# Patient Record
Sex: Female | Born: 1950 | Race: White | Hispanic: No | Smoking: Never smoker
Health system: Southern US, Community
[De-identification: ages and names within clinical notes are randomized; demographics above are authoritative.]

## PROBLEM LIST (undated history)

## (undated) DIAGNOSIS — I341 Nonrheumatic mitral (valve) prolapse: Secondary | ICD-10-CM

## (undated) DIAGNOSIS — K4090 Unilateral inguinal hernia, without obstruction or gangrene, not specified as recurrent: Secondary | ICD-10-CM

## (undated) DIAGNOSIS — K449 Diaphragmatic hernia without obstruction or gangrene: Secondary | ICD-10-CM

## (undated) DIAGNOSIS — N200 Calculus of kidney: Secondary | ICD-10-CM

## (undated) HISTORY — PX: LITHOTRIPSY: SUR834

---

## 2016-10-03 DIAGNOSIS — N200 Calculus of kidney: Secondary | ICD-10-CM | POA: Diagnosis not present

## 2016-10-08 DIAGNOSIS — M5430 Sciatica, unspecified side: Secondary | ICD-10-CM | POA: Diagnosis not present

## 2016-12-01 ENCOUNTER — Emergency Department (HOSPITAL_COMMUNITY): Admission: EM | Admit: 2016-12-01 | Payer: Self-pay | Source: Home / Self Care

## 2016-12-01 ENCOUNTER — Inpatient Hospital Stay (HOSPITAL_COMMUNITY)
Admission: EM | Admit: 2016-12-01 | Discharge: 2016-12-04 | DRG: 479 | Disposition: A | Discharge status: Home Health | Payer: BC Managed Care – PPO | Attending: Internal Medicine | Admitting: Internal Medicine

## 2016-12-01 ENCOUNTER — Encounter (HOSPITAL_COMMUNITY): Payer: Self-pay | Admitting: Internal Medicine

## 2016-12-01 DIAGNOSIS — R739 Hyperglycemia, unspecified: Secondary | ICD-10-CM | POA: Diagnosis present

## 2016-12-01 DIAGNOSIS — M4620 Osteomyelitis of vertebra, site unspecified: Secondary | ICD-10-CM | POA: Diagnosis not present

## 2016-12-01 DIAGNOSIS — D638 Anemia in other chronic diseases classified elsewhere: Secondary | ICD-10-CM | POA: Diagnosis present

## 2016-12-01 DIAGNOSIS — M4626 Osteomyelitis of vertebra, lumbar region: Secondary | ICD-10-CM | POA: Diagnosis present

## 2016-12-01 DIAGNOSIS — G8929 Other chronic pain: Secondary | ICD-10-CM | POA: Diagnosis present

## 2016-12-01 DIAGNOSIS — E876 Hypokalemia: Secondary | ICD-10-CM

## 2016-12-01 DIAGNOSIS — Z87442 Personal history of urinary calculi: Secondary | ICD-10-CM

## 2016-12-01 DIAGNOSIS — M25552 Pain in left hip: Secondary | ICD-10-CM | POA: Diagnosis present

## 2016-12-01 DIAGNOSIS — I341 Nonrheumatic mitral (valve) prolapse: Secondary | ICD-10-CM | POA: Diagnosis present

## 2016-12-01 DIAGNOSIS — B9620 Unspecified Escherichia coli [E. coli] as the cause of diseases classified elsewhere: Secondary | ICD-10-CM | POA: Diagnosis present

## 2016-12-01 DIAGNOSIS — D649 Anemia, unspecified: Secondary | ICD-10-CM | POA: Diagnosis present

## 2016-12-01 DIAGNOSIS — M545 Low back pain, unspecified: Secondary | ICD-10-CM

## 2016-12-01 DIAGNOSIS — M4646 Discitis, unspecified, lumbar region: Secondary | ICD-10-CM | POA: Diagnosis present

## 2016-12-01 HISTORY — DX: Diaphragmatic hernia without obstruction or gangrene: K44.9

## 2016-12-01 HISTORY — DX: Nonrheumatic mitral (valve) prolapse: I34.1

## 2016-12-01 HISTORY — DX: Calculus of kidney: N20.0

## 2016-12-01 HISTORY — DX: Unilateral inguinal hernia, without obstruction or gangrene, not specified as recurrent: K40.90

## 2016-12-01 LAB — COMPREHENSIVE METABOLIC PANEL
ALK PHOS: 92 U/L (ref 38–126)
ALT: 10 U/L — ABNORMAL LOW (ref 14–54)
ANION GAP: 9 (ref 5–15)
AST: 17 U/L (ref 15–41)
Albumin: 3.3 g/dL — ABNORMAL LOW (ref 3.5–5.0)
BILIRUBIN TOTAL: 0.4 mg/dL (ref 0.3–1.2)
BUN: 8 mg/dL (ref 6–20)
CALCIUM: 8.9 mg/dL (ref 8.9–10.3)
CO2: 25 mmol/L (ref 22–32)
Chloride: 101 mmol/L (ref 101–111)
Creatinine, Ser: 0.47 mg/dL (ref 0.44–1.00)
GFR calc non Af Amer: >60 mL/min (ref >60)
Glucose, Bld: 118 mg/dL — ABNORMAL HIGH (ref 65–99)
POTASSIUM: 3.4 mmol/L — AB (ref 3.5–5.1)
SODIUM: 135 mmol/L (ref 135–145)
Total Protein: 6.9 g/dL (ref 6.5–8.1)

## 2016-12-01 LAB — CBC WITH DIFFERENTIAL/PLATELET
BASOS PCT: 0 %
Basophils Absolute: 0 10*3/uL (ref 0.0–0.1)
Eosinophils Absolute: 0.1 10*3/uL (ref 0.0–0.7)
Eosinophils Relative: 1 %
HEMATOCRIT: 32 % — AB (ref 36.0–46.0)
HEMOGLOBIN: 10.4 g/dL — AB (ref 12.0–15.0)
Lymphocytes Relative: 22 %
Lymphs Abs: 1.2 10*3/uL (ref 0.7–4.0)
MCH: 28.7 pg (ref 26.0–34.0)
MCHC: 32.5 g/dL (ref 30.0–36.0)
MCV: 88.4 fL (ref 78.0–100.0)
Monocytes Absolute: 0.2 10*3/uL (ref 0.1–1.0)
Monocytes Relative: 5 %
NEUTROS ABS: 3.9 10*3/uL (ref 1.7–7.7)
NEUTROS PCT: 72 %
Platelets: 352 10*3/uL (ref 150–400)
RBC: 3.62 MIL/uL — AB (ref 3.87–5.11)
RDW: 13.6 % (ref 11.5–15.5)
WBC: 5.3 10*3/uL (ref 4.0–10.5)

## 2016-12-01 LAB — URINALYSIS, ROUTINE W REFLEX MICROSCOPIC
Bilirubin Urine: NEGATIVE
Glucose, UA: NEGATIVE mg/dL
HGB URINE DIPSTICK: NEGATIVE
Ketones, ur: 20 mg/dL — AB
Leukocytes, UA: NEGATIVE
NITRITE: NEGATIVE
PROTEIN: NEGATIVE mg/dL
Specific Gravity, Urine: 1.013 (ref 1.005–1.030)
pH: 6 (ref 5.0–8.0)

## 2016-12-01 LAB — I-STAT BETA HCG BLOOD, ED (MC, WL, AP ONLY): I-stat hCG, quantitative: <5 m[IU]/mL (ref <5)

## 2016-12-01 LAB — I-STAT CG4 LACTIC ACID, ED: Lactic Acid, Venous: 1.1 mmol/L (ref 0.5–1.9)

## 2016-12-01 MED ORDER — ACETAMINOPHEN 325 MG PO TABS
650.0000 mg | ORAL_TABLET | Freq: Four times a day (QID) | ORAL | Status: DC | PRN
  Administered 2016-12-01 – 2016-12-03 (×4): 650 mg via ORAL
  Filled 2016-12-01 (×4): qty 2

## 2016-12-01 MED ORDER — ACETAMINOPHEN 650 MG RE SUPP: 650.0000 mg | Freq: Four times a day (QID) | RECTAL | Status: DC | PRN

## 2016-12-01 NOTE — ED Notes (Signed)
Pt here for abnormal MRI results and has disc from [facility]. Pt states she was told she has a bulging disc and possible infection.

## 2016-12-01 NOTE — ED Provider Notes (Signed)
[facility] DEPT Provider Note   CSN: [id number] Arrival date & time: 12/01/16  1117  History   Chief Complaint Chief Complaint  Patient presents with  . Back Pain    HPI Audrey LINDSLEY is a 66 y.o. female.  This is a 66 year old female with history of nephrolithiasis status post lithotripsy 2 weeks prior who presents with recurrent back pain for the past 2 months.  Patient denies any falls, recent travel, fevers, night sweats, chills, weight loss.  She does not smoke, denies illicit drug use especially IV drug use.  Patient stated she initially had sciatica over 1 month ago and was diagnosed with a herniated disc she was given ibuprofen, Tylenol as well as tramadol.  She followed up with sports medicine however her back pain continued after her kidney stone was treated.  Patient was evaluated recently at [facility] where an MRI of the lumbar spine was performed.  Apparently they called the patient today with results and that she should go to the ED for further management.  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  Denies sick contacts, denies history of cancer, denies any sciatica-like symptoms, decreased sensation, urinary or stool incontinence.  Denies any skin rashes.   The history is provided by the patient and a relative.    Past Medical History:  Diagnosis Date  . Hiatal hernia   . Inguinal hernia   . Mitral valve prolapse   . Nephrolithiasis     Patient Active Problem List   Diagnosis Date Noted  . 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 CESAREAN SECTION    . LITHOTRIPSY      OB History    No data available       Home Medications    Prior to Admission medications   Medication Sig Start Date End Date Taking? Authorizing Provider  ibuprofen (ADVIL,MOTRIN) 800 MG tablet Take 800 mg by mouth every 8 (eight) hours. With food 11/20/16   Yes [provider]  traMADol (ULTRAM) 50 MG tablet Take 50 mg by mouth every 6 (six) hours as needed for pain. 10/25/16  Yes [provider]    Family History History reviewed. No pertinent family history.  Social History Social History  Substance Use Topics  . Smoking status: Never Smoker  . Smokeless tobacco: Never Used  . Alcohol use No     Allergies   Patient has no known allergies.   Review of Systems Review of Systems  Constitutional: Negative for appetite change, chills, diaphoresis, fatigue, fever and unexpected weight change.  HENT: Negative for ear pain and sore throat.   Eyes: Negative for pain and visual disturbance.  Respiratory: Negative for cough and shortness of breath.   Cardiovascular: Positive for leg swelling. Negative for chest pain and palpitations.  Gastrointestinal: Negative for abdominal pain, constipation, diarrhea and vomiting.  Genitourinary: Negative for decreased urine volume, dysuria and hematuria.  Musculoskeletal: Positive for back pain. Negative for arthralgias and gait problem.  Skin: Negative for color change and rash.  Allergic/Immunologic: Negative for immunocompromised state.  Neurological: Negative for dizziness, tremors, seizures, syncope, facial asymmetry, speech difficulty, weakness, light-headedness, numbness and headaches.  All other systems reviewed and are negative.    Physical Exam Updated Vital Signs BP 129/74   Pulse 70   Temp 98.1 F (36.7 C) (Oral)   Resp 17   SpO2 97%  Physical Exam  Constitutional: She appears well-developed and well-nourished. No distress.  HENT:  Head: Normocephalic and atraumatic.  Eyes: Pupils are equal, round, and reactive to light. Conjunctivae are normal.  Neck: Neck supple.  Cardiovascular: Normal rate and regular rhythm.   No murmur heard. Pulmonary/Chest: Effort normal and breath sounds normal. No respiratory distress.  Abdominal: Soft. There is no tenderness.    Musculoskeletal: She exhibits no edema.       Cervical back: She exhibits normal range of motion, no tenderness and no bony tenderness.       Thoracic back: She exhibits normal range of motion, no tenderness and no bony tenderness.       Lumbar back: She exhibits normal range of motion, no tenderness, no bony tenderness, no swelling and no edema.  Mild scoliosis present with rightward curvature. Cervical kyphosis.  Neurological: She is alert. She has normal strength. She displays no tremor. No cranial nerve deficit or sensory deficit.  Skin: Skin is warm and dry.  Psychiatric: She has a normal mood and affect.  Nursing note and vitals reviewed.    ED Treatments / Results  Labs (all labs ordered are listed, but only abnormal results are displayed) Labs Reviewed  COMPREHENSIVE METABOLIC PANEL - Abnormal; Notable for the following:       Result Value   Potassium 3.4 (*)    Glucose, Bld 118 (*)    Albumin 3.3 (*)    ALT 10 (*)    All other components within normal limits  CBC WITH DIFFERENTIAL/PLATELET - Abnormal; Notable for the following:    RBC 3.62 (*)    Hemoglobin 10.4 (*)    HCT 32.0 (*)    All other components within normal limits  URINALYSIS, ROUTINE W REFLEX MICROSCOPIC - Abnormal; Notable for the following:    Ketones, ur 20 (*)    All other components within normal limits  CULTURE, BLOOD (ROUTINE X 2)  CULTURE, BLOOD (ROUTINE X 2)  QUANTIFERON TB GOLD ASSAY (BLOOD)  I-STAT BETA HCG BLOOD, ED ([facility], [facility], [facility] ONLY)    EKG  EKG Interpretation  Date/Time:  Thursday December 01 2016 18:29:39 EDT Ventricular Rate:  67 PR Interval:    QRS Duration: 93 QT Interval:  396 QTC Calculation: 418 R Axis:   73 Text Interpretation:  Sinus rhythm Borderline T abnormalities, anterior leads No previous ECGs available Confirmed by Theotis Burrow [phone]) on 12/01/2016 7:43:59 PM       Radiology No results found.  Procedures Procedures (including critical care  time)  Medications Ordered in ED Medications - No data to display   Initial Impression / Assessment and Plan / ED Course  I have reviewed the triage vital signs and the nursing notes.  Pertinent labs & imaging results that were available during my care of the patient were reviewed by me and considered in my medical decision making (see chart for details).     This is a 66 year old female with history of nephrolithiasis status post lithotripsy 2 weeks prior who presents with recurrent back pain for the past 2 months.  Blood cultures drawn, lactate within normal limits.  Exam as noted above, patient neurologically intact with no focal deficits. Bony spine visualized and nontender to palpation.  MRI independently reviewed, radiology report summarizes severe L2-L3 diskitis and concern for osteomyelitis with posterior protruding disc material contributing to severe stenosis.   Hgb 10, no leukocytosis. Blood cultures drawn x2. No antibiotics started in case of need for direct aspiration. Quantiferon gold  assay ordered, UDS ordered.  Neurosurgery called regarding MRI findings.  They stated there is no acute surgical intervention at this time.  Patient should be admitted to medicine with discussion of antibiotic therapy after potential interventional radiology assessment for aspiration.  Discussed admission with medicine who agreed. Patient updated on plan and in agreement. All questions answered. Patient denies pain at this time and declined pain medications.  Final Clinical Impressions(s) / ED Diagnoses   Final diagnoses:  Discitis of lumbar region  Chronic midline low back pain without sciatica    New Prescriptions New Prescriptions   No medications on file      Aldona Lento, MD 12/01/16 2327    Little, Wenda Overland, MD 12/02/16 1233

## 2016-12-01 NOTE — ED Notes (Signed)
Pt given [place] sandwich bag with applesauce, saltines, graham crackers, peanut butter, and water.

## 2016-12-01 NOTE — ED Notes (Signed)
MD Little at the bedside

## 2016-12-01 NOTE — ED Notes (Signed)
[facility] will do next pick up tomorrow morning so collect quantiferon tb gold assay by 0930 on 12/02/2016.  Nurse aware.

## 2016-12-01 NOTE — Progress Notes (Signed)
Called by Dr. Aldona Lento ([place]) who wanted to discuss this patient's case. He did not request formal neurosurgical consultation. He explained that the patient was referred to ER after having had an MRI 3 days ago showing evidence of L2-3 discitis/osteomyelitis. He reports that the patient is having difficulties with back pain, but is neurologically intact. I reviewed the patient's MRI at his request. I feel that she is going to need to be treated for her discitis/osteomyelitis, and recommended admission to the medical service, with infectious disease consultation. I do not feel that she needs neurosurgical intervention at this time. I recommended blood cultures, which he explained had already been done. I anticipate that she may well need a disc space aspiration by interventional radiology for culture. We are available if neurosurgical intervention were to become necessary, but hopefully the patient will be able to be successfully treated with nonsurgical management and follow-up with her primary physician.

## 2016-12-01 NOTE — H&P (Signed)
History and Physical    Audrey Walter RDE:[id number] DOB: 1950-11-18 DOA: 12/01/2016  PCP: Melony Overly, MD Consultants:  Erlinda Hong - urology; Jodi Mourning - orthopedic PA Patient coming from:  Home - lives alone but her daughter is staying with her right now; [facility]: daughter, [phone]  Chief Complaint: back pain  HPI: Audrey Walter is a 66 y.o. female with medical history significant of nephrolithiasis and back pain presenting due to abnormal MRI.  She had a kidney infection a couple of months ago in [place].  She has had "bone scans, MRI, xrays galore".  Kidney stone with lithotrypsy 2 weeks ago.  Hip pain, back pain for 2 months.  They sent her to orthopedics and urology because they weren't sure what was causing her problems.  She has also been to urgent care.  She had an MRI and had an appointment with orthopedics today and he was concerned about osteo and sent her to the ER.  The pain was in her low back but has moved to her right hip and groin.   Denies h/o TB or risky lifestyle behaviors.   ED Course:  Neurosurgery consulted - no acute surgical intervention needed.  Admit, call ID.  Blood cultures, disc space aspiration for culture by IR.  Review of Systems: As per HPI; otherwise review of systems reviewed and negative.   Ambulatory Status:  Ambulates without assistance  Past Medical History:  Diagnosis Date  . Hiatal hernia   . Inguinal hernia   . Mitral valve prolapse   . Nephrolithiasis     Past Surgical History:  Procedure Laterality Date  . CESAREAN SECTION    . LITHOTRIPSY      Social History   Social History  . Marital status: Divorced    Spouse name: N/A  . Number of children: N/A  . Years of education: N/A   Occupational History  . custodian    Social History Main Topics  . Smoking status: Never Smoker  . Smokeless tobacco: Never Used  . Alcohol use No  . Drug use: No  . Sexual activity: Not on file   Other Topics Concern  . Not on file   Social  History Narrative  . No narrative on file    No Known Allergies  History reviewed. No pertinent family history.  Prior to Admission medications   Not on File    Physical Exam: Vitals:   12/01/16 2300 12/01/16 2330 12/02/16 0000 12/02/16 0039  BP: 130/81 135/72 138/71 (!) 155/76  Pulse: 76 65 72 70  Resp: 18 18 20 20   Temp:    98.7 F (37.1 C)  TempSrc:    Oral  SpO2: 98% 97% 97% 98%  Weight:    44.5 kg (98 lb 3.2 oz)  Height:    4' 11"  (1.499 m)     General:  Appears calm and comfortable and is NAD Eyes:  PERRL, EOMI, normal lids, iris ENT:  grossly normal hearing, lips & tongue, mmm Neck:  no LAD, masses or thyromegaly; no carotid bruits Cardiovascular:  RRR, no m/r/g. No LE edema.  Respiratory:   CTA bilaterally with no wheezes/rales/rhonchi.  Normal respiratory effort. Abdomen:  soft, NT, ND, NABS Back:   normal alignment, no CVAT Skin:  no rash or induration seen on limited exam Musculoskeletal:  grossly normal tone BUE/BLE, good ROM, no bony abnormality Lower extremity:  No LE edema.  Limited foot exam with no ulcerations.  2+ distal pulses. Psychiatric:  grossly normal mood  and affect, speech fluent and appropriate, AOx3 Neurologic:  CN 2-12 grossly intact, moves all extremities in coordinated fashion, sensation intact    Radiological Exams on Admission: No results found.  EKG: Independently reviewed.  NSR with rate 67; nonspecific ST changes with no evidence of acute ischemia   Labs on Admission: I have personally reviewed the available labs and imaging studies at the time of the admission.  Pertinent labs:   Glucose 118 WBC 5.3 Hgb 10.4 UA 20 ketones Lactate 1.10 MRI with severe L2-3 diskitis, concern for osteomyelitis with posterior protruding disc material contributing to severe stenosis.     Assessment/Plan Principal Problem:   Vertebral osteomyelitis (HCC) Active Problems:   Hyperglycemia   Normocytic anemia   Vertebral diskitis with  concern for osteomyelitis -Patient presenting with back pain, recent MRI that is concerning for osteomyelitis. -Will check ESR and CRP.  Concerning lab findings would include CRP >13.5, elevated ESR (goal is < 1/2 age +54 if female).   -WBC is often <15; in this case hers is 5.3. -Blood cultures pending. -Will request IR consultation for aspiration and culture. -Will need antibiotics for 4-6 weeks - but will hold until culture can be obtained. -Suggest ID consultation in AM. -With vertebral osteo, needs testing for TB.  This has been ordered. -Further important considerations include nutrition, diabetes control, tobacco cessation.  Will order nutrition consult.  Mild hyperglycemia, will follow.  No h/o tobacco. -Vertebral osteo is more common in the L-spine and usually presents with severe pain.  Will treat with morphine for now. -Anemia may be related, will follow.  DVT prophylaxis: Lovenox Code Status: Full - confirmed with patient/family Family Communication: Daughter present throughout evaluation  Disposition Plan:  Home once clinically improved Consults called: Neurosurgery by telephone; IR; nutrition; will need ID in AM  Admission status: Admit - It is my clinical opinion that admission to INPATIENT is reasonable and necessary because this patient will require at least 2 midnights in the hospital to treat this condition based on the medical complexity of the problems presented.  Given the aforementioned information, the predictability of an adverse outcome is felt to be significant.    Karmen Bongo MD [facility]  If note is complete, please contact covering daytime or nighttime physician. [email or web address] Password [email or web address]  12/02/2016, 12:50 AM

## 2016-12-02 ENCOUNTER — Inpatient Hospital Stay (HOSPITAL_COMMUNITY): Payer: BC Managed Care – PPO

## 2016-12-02 ENCOUNTER — Encounter (HOSPITAL_COMMUNITY): Payer: Self-pay | Admitting: General Surgery

## 2016-12-02 DIAGNOSIS — M545 Low back pain, unspecified: Secondary | ICD-10-CM

## 2016-12-02 DIAGNOSIS — M4646 Discitis, unspecified, lumbar region: Secondary | ICD-10-CM

## 2016-12-02 DIAGNOSIS — M4620 Osteomyelitis of vertebra, site unspecified: Secondary | ICD-10-CM

## 2016-12-02 DIAGNOSIS — M4626 Osteomyelitis of vertebra, lumbar region: Principal | ICD-10-CM

## 2016-12-02 DIAGNOSIS — D649 Anemia, unspecified: Secondary | ICD-10-CM | POA: Diagnosis present

## 2016-12-02 DIAGNOSIS — E876 Hypokalemia: Secondary | ICD-10-CM

## 2016-12-02 DIAGNOSIS — R739 Hyperglycemia, unspecified: Secondary | ICD-10-CM | POA: Diagnosis present

## 2016-12-02 HISTORY — PX: IR FLUORO GUIDED NEEDLE PLC ASPIRATION/INJECTION LOC: IMG2395

## 2016-12-02 LAB — IRON AND TIBC
IRON: 63 ug/dL (ref 28–170)
SATURATION RATIOS: 27 % (ref 10.4–31.8)
TIBC: 237 ug/dL — AB (ref 250–450)
UIBC: 174 ug/dL

## 2016-12-02 LAB — CBC
HEMATOCRIT: 30.2 % — AB (ref 36.0–46.0)
HEMOGLOBIN: 9.9 g/dL — AB (ref 12.0–15.0)
MCH: 28.9 pg (ref 26.0–34.0)
MCHC: 32.8 g/dL (ref 30.0–36.0)
MCV: 88 fL (ref 78.0–100.0)
PLATELETS: 315 10*3/uL (ref 150–400)
RBC: 3.43 MIL/uL — AB (ref 3.87–5.11)
RDW: 13.7 % (ref 11.5–15.5)
WBC: 4.2 10*3/uL (ref 4.0–10.5)

## 2016-12-02 LAB — BASIC METABOLIC PANEL
Anion gap: 7 (ref 5–15)
BUN: 5 mg/dL — ABNORMAL LOW (ref 6–20)
CHLORIDE: 104 mmol/L (ref 101–111)
CO2: 26 mmol/L (ref 22–32)
CREATININE: 0.51 mg/dL (ref 0.44–1.00)
Calcium: 8.9 mg/dL (ref 8.9–10.3)
GFR calc non Af Amer: >60 mL/min (ref >60)
Glucose, Bld: 93 mg/dL (ref 65–99)
POTASSIUM: 3.4 mmol/L — AB (ref 3.5–5.1)
Sodium: 137 mmol/L (ref 135–145)

## 2016-12-02 LAB — PROTIME-INR
INR: 1.1
PROTHROMBIN TIME: 14.2 s (ref 11.4–15.2)

## 2016-12-02 LAB — FERRITIN: Ferritin: 167 ng/mL (ref 11–307)

## 2016-12-02 LAB — RETICULOCYTES
RBC.: 3.46 MIL/uL — ABNORMAL LOW (ref 3.87–5.11)
Retic Count, Absolute: 45 10*3/uL (ref 19.0–186.0)
Retic Ct Pct: 1.3 % (ref 0.4–3.1)

## 2016-12-02 LAB — C-REACTIVE PROTEIN: CRP: 1.6 mg/dL — ABNORMAL HIGH (ref <1.0)

## 2016-12-02 LAB — SEDIMENTATION RATE: Sed Rate: 45 mm/hr — ABNORMAL HIGH (ref 0–22)

## 2016-12-02 MED ORDER — VANCOMYCIN HCL 500 MG IV SOLR
500.0000 mg | Freq: Two times a day (BID) | INTRAVENOUS | Status: DC
  Administered 2016-12-03: 500 mg via INTRAVENOUS
  Filled 2016-12-02 (×2): qty 500

## 2016-12-02 MED ORDER — MIDAZOLAM HCL 2 MG/2ML IJ SOLN
INTRAMUSCULAR | Status: AC
  Filled 2016-12-02: qty 2

## 2016-12-02 MED ORDER — BUPIVACAINE HCL (PF) 0.5 % IJ SOLN
INTRAMUSCULAR | Status: AC
  Filled 2016-12-02: qty 30

## 2016-12-02 MED ORDER — FENTANYL CITRATE (PF) 100 MCG/2ML IJ SOLN
INTRAMUSCULAR | Status: AC | PRN
  Administered 2016-12-02: 25 ug via INTRAVENOUS

## 2016-12-02 MED ORDER — ONDANSETRON HCL 4 MG/2ML IJ SOLN: 4.0000 mg | Freq: Four times a day (QID) | INTRAMUSCULAR | Status: DC | PRN

## 2016-12-02 MED ORDER — LACTATED RINGERS IV SOLN
INTRAVENOUS | Status: DC
  Administered 2016-12-02 (×2): via INTRAVENOUS

## 2016-12-02 MED ORDER — MORPHINE SULFATE (PF) 2 MG/ML IV SOLN: 2.0000 mg | INTRAVENOUS | Status: DC | PRN

## 2016-12-02 MED ORDER — SODIUM CHLORIDE 0.9 % IV SOLN
INTRAVENOUS | Status: AC
  Administered 2016-12-02: 17:00:00 via INTRAVENOUS

## 2016-12-02 MED ORDER — DOCUSATE SODIUM 100 MG PO CAPS
100.0000 mg | ORAL_CAPSULE | Freq: Two times a day (BID) | ORAL | Status: DC
  Administered 2016-12-02 – 2016-12-04 (×4): 100 mg via ORAL
  Filled 2016-12-02 (×5): qty 1

## 2016-12-02 MED ORDER — MIDAZOLAM HCL 2 MG/2ML IJ SOLN
INTRAMUSCULAR | Status: AC | PRN
  Administered 2016-12-02: 1 mg via INTRAVENOUS

## 2016-12-02 MED ORDER — VANCOMYCIN HCL IN DEXTROSE 750-5 MG/150ML-% IV SOLN
750.0000 mg | Freq: Once | INTRAVENOUS | Status: AC
  Administered 2016-12-02: 750 mg via INTRAVENOUS
  Filled 2016-12-02: qty 150

## 2016-12-02 MED ORDER — ENOXAPARIN SODIUM 40 MG/0.4ML ~~LOC~~ SOLN: 40.0000 mg | SUBCUTANEOUS | Status: DC

## 2016-12-02 MED ORDER — POTASSIUM CHLORIDE CRYS ER 20 MEQ PO TBCR
40.0000 meq | EXTENDED_RELEASE_TABLET | Freq: Once | ORAL | Status: AC
  Administered 2016-12-02: 40 meq via ORAL
  Filled 2016-12-02: qty 2

## 2016-12-02 MED ORDER — DEXTROSE 5 % IV SOLN
2.0000 g | INTRAVENOUS | Status: DC
  Administered 2016-12-02 – 2016-12-04 (×3): 2 g via INTRAVENOUS
  Filled 2016-12-02 (×3): qty 2

## 2016-12-02 MED ORDER — BUPIVACAINE HCL 0.25 % IJ SOLN
INTRAMUSCULAR | Status: AC | PRN
Start: 2016-12-02 — End: 2016-12-02
  Administered 2016-12-02: 10 mL

## 2016-12-02 MED ORDER — ONDANSETRON HCL 4 MG PO TABS: 4.0000 mg | ORAL_TABLET | Freq: Four times a day (QID) | ORAL | Status: DC | PRN

## 2016-12-02 MED ORDER — FENTANYL CITRATE (PF) 100 MCG/2ML IJ SOLN
INTRAMUSCULAR | Status: AC
  Filled 2016-12-02: qty 2

## 2016-12-02 MED ORDER — VANCOMYCIN HCL 500 MG IV SOLR
500.0000 mg | Freq: Two times a day (BID) | INTRAVENOUS | Status: DC
  Filled 2016-12-02: qty 500

## 2016-12-02 NOTE — Progress Notes (Addendum)
Pharmacy Antibiotic Note  Audrey Walter is a 66 y.o. female admitted on 12/01/2016 with lumbar diskitis / osteomyelitis.  Plan for IR to aspirate for cx prior to starting antibiotics.  Pharmacy has been consulted for Vancomycin dosing.  Pt also on Rocephin (per MD) pending cx data.  Plan: Vancomycin 750mg  IV x 1, followed by 500mg  IV q12h (goal trough 15-20) Vancomycin trough at steady state (likely Sunday) Rocephin 2gm IV q24h (per MD) Follow renal function, clinical progress, and cx data  Height: 4\' 11"  (149.9 cm) Weight: 98 lb 3.2 oz (44.5 kg) IBW/kg (Calculated) : 43.2  Temp (24hrs), Avg:98.3 F (36.8 C), Min:98 F (36.7 C), Max:98.7 F (37.1 C)   Recent Labs Lab 12/01/16 1147 12/01/16 1814 12/02/16 0640  WBC  --  5.3 4.2  CREATININE  --  0.47 0.51  LATICACIDVEN 1.10  --   --     Estimated Creatinine Clearance: 45.9 mL/min (by C-G formula based on SCr of 0.51 mg/dL).    No Known Allergies  Antimicrobials this admission: Vanc 9/21 >> Rocephin 9/21 >>  Dose adjustments this admission:   Microbiology results: 9/20 blood x 2 >> 9/21 disc aspiration >>  Thank you for allowing pharmacy to be a part of this patient's care.  [employer], Pharm.D., BCPS Clinical Pharmacist 12/02/2016 4:45 PM

## 2016-12-02 NOTE — Procedures (Signed)
S/P fluoro guided disc aspiration at L2-L3 .

## 2016-12-02 NOTE — Sedation Documentation (Signed)
Patient denies pain and is resting comfortably.  

## 2016-12-02 NOTE — Progress Notes (Signed)
Initial Nutrition Assessment  DOCUMENTATION CODES:   Not applicable  INTERVENTION:  - Monitor for diet advancement and provide medically appropriate nutrition supplement  NUTRITION DIAGNOSIS:   Inadequate oral intake related to poor appetite, social / environmental circumstances as evidenced by per patient/family report.  GOAL:   Patient will meet greater than or equal to 90% of their needs  MONITOR:   Diet advancement, Labs, I & O's  REASON FOR ASSESSMENT:   Consult Assessment of nutrition requirement/status  ASSESSMENT:   Pt with PMH of nephrolithiasis and back pain presented with vertebral osteomyelitis, s/p abnormal MRI.    Patient not available at time of visit, in IR.  RD interviewed pt's sister to obtain partial nutritional history. Pt's sister reports pt has maintained a UBW of ~100-115 lbs the past 40 years. No recent weight history available per chart. Pt's sister reports pt consumes very little in a day. Reporting pt could include 2 pieces of toast or a pack of crackers, then works for 8 hours without breaking to eat.  No meal completion percentage completed given pt's NPO status. Per pt's sister, pt has never tried a nutritional supplement but enjoys an occasional milkshake.   Unable to complete nutrition focused physical exam at this time due to pt being out of room. Suspect severe malnutrition, RD to follow-up.   Labs reviewed; K 3.4 Medications; colace  Diet Order:  Diet NPO time specified Except for: Sips with Meds  Skin:  Reviewed, no issues  Last BM:  12/01/16  Height:   Ht Readings from Last 1 Encounters:  12/02/16 4\' 11"  (1.499 m)    Weight:   Wt Readings from Last 1 Encounters:  12/02/16 98 lb 3.2 oz (44.5 kg)    Ideal Body Weight:  45 kg  BMI:  Body mass index is 19.83 kg/m.  Estimated Nutritional Needs:   Kcal:  2119-4174  Protein:  67-85  Fluid:  >/= 1.5 L/d  EDUCATION NEEDS:   Education needs no appropriate at this  time  [place] Ranger, MS, RDN, LDN 12/02/2016 4:08 PM

## 2016-12-02 NOTE — Consult Note (Signed)
Chief Complaint: L2-3 diskitis, osteomyelitis   Referring Physician:Dr. Karmen Bongo  Supervising Physician: Luanne Bras  Patient Status: [facility] - In-pt  HPI: Audrey Walter is a 66 y.o. female who was having a lot of issues with nephrolithiasis and had just passed a stone in mid-July.  After that she started having back pain.  She just assumed it was from another stone.  Over the last 2 months she has been pending workup between urology and orthopedics over back/hip pain.  She finally had an MRI yesterday which I can't see right now, but showed L2-3 diskitis/osteomyelitis.  She has been sent to [facility] for admission and abx therapy.  We have been asked to see her for disc aspiration for culture.  Past Medical History:  Past Medical History:  Diagnosis Date  . Hiatal hernia   . Inguinal hernia   . Mitral valve prolapse   . Nephrolithiasis     Past Surgical History:  Past Surgical History:  Procedure Laterality Date  . CESAREAN SECTION    . LITHOTRIPSY      Family History: History reviewed. No pertinent family history.  Social History:  reports that she has never smoked. She has never used smokeless tobacco. She reports that she does not drink alcohol or use drugs.  Allergies: No Known Allergies  Medications: Medications reviewed in epic  Please HPI for pertinent positives, otherwise complete 10 system ROS negative.  Mallampati Score: MD Evaluation Airway: WNL Heart: WNL Abdomen: WNL Chest/ Lungs: WNL ASA  Classification: 2 Mallampati/Airway Score: One  Physical Exam: BP 137/64 (BP Location: Right Arm)   Pulse 77   Temp 98.2 F (36.8 C) (Oral)   Resp 20   Ht _0  (1.499 m)   Wt 98 lb 3.2 oz (44.5 kg)   SpO2 96%   BMI 19.83 kg/m  Body mass index is 19.83 kg/m. General: pleasant, WD, WN white female who is laying in bed in NAD HEENT: head is normocephalic, atraumatic.  Sclera are noninjected.  PERRL.  Ears and nose without any masses or lesions.   Mouth is pink and moist Heart: regular, rate, and rhythm.  Normal s1,s2. No obvious murmurs, gallops, or rubs noted.  Palpable radial and pedal pulses bilaterally Lungs: CTAB, no wheezes, rhonchi, or rales noted.  Respiratory effort nonlabored Abd: soft, NT, ND, +BS, no masses, hernias, or organomegaly Psych: A&Ox3 with an appropriate affect.   Labs: Results for orders placed or performed during the hospital encounter of 12/01/16 (from the past 48 hour(s))  Comprehensive metabolic panel     Status: Abnormal   Collection Time: 12/01/16  6:14 PM  Result Value Ref Range   Sodium 135 135 - 145 mmol/L   Potassium 3.4 (L) 3.5 - 5.1 mmol/L   Chloride 101 101 - 111 mmol/L   CO2 25 22 - 32 mmol/L   Glucose, Bld 118 (H) 65 - 99 mg/dL   BUN 8 6 - 20 mg/dL   Creatinine, Ser 0.47 0.44 - 1.00 mg/dL   Calcium 8.9 8.9 - 10.3 mg/dL   Total Protein 6.9 6.5 - 8.1 g/dL   Albumin 3.3 (L) 3.5 - 5.0 g/dL   AST 17 15 - 41 U/L   ALT 10 (L) 14 - 54 U/L   Alkaline Phosphatase 92 38 - 126 U/L   Total Bilirubin 0.4 0.3 - 1.2 mg/dL   GFR calc non Af Amer >60 >60 mL/min   GFR calc Af Amer >60 >60 mL/min    Comment: (  NOTE) The eGFR has been calculated using the CKD EPI equation. This calculation has not been validated in all clinical situations. eGFR's persistently <60 mL/min signify possible Chronic Kidney Disease.    Anion gap 9 5 - 15  CBC with Differential     Status: Abnormal   Collection Time: 12/01/16  6:14 PM  Result Value Ref Range   WBC 5.3 4.0 - 10.5 K/uL   RBC 3.62 (L) 3.87 - 5.11 MIL/uL   Hemoglobin 10.4 (L) 12.0 - 15.0 g/dL   HCT 32.0 (L) 36.0 - 46.0 %   MCV 88.4 78.0 - 100.0 fL   MCH 28.7 26.0 - 34.0 pg   MCHC 32.5 30.0 - 36.0 g/dL   RDW 13.6 11.5 - 15.5 %   Platelets 352 150 - 400 K/uL   Neutrophils Relative % 72 %   Neutro Abs 3.9 1.7 - 7.7 K/uL   Lymphocytes Relative 22 %   Lymphs Abs 1.2 0.7 - 4.0 K/uL   Monocytes Relative 5 %   Monocytes Absolute 0.2 0.1 - 1.0 K/uL    Eosinophils Relative 1 %   Eosinophils Absolute 0.1 0.0 - 0.7 K/uL   Basophils Relative 0 %   Basophils Absolute 0.0 0.0 - 0.1 K/uL  I-Stat beta hCG blood, ED ([facility], [facility], [facility] only)     Status: None   Collection Time: 12/01/16  6:22 PM  Result Value Ref Range   I-stat hCG, quantitative <5.0 <5 mIU/mL   Comment 3            Comment:   GEST. AGE      CONC.  (mIU/mL)   <=1 WEEK        5 - 50     2 WEEKS       50 - 500     3 WEEKS       100 - 10,000     4 WEEKS     1,000 - 30,000        FEMALE AND NON-PREGNANT FEMALE:     LESS THAN 5 mIU/mL   Urinalysis, Routine w reflex microscopic     Status: Abnormal   Collection Time: 12/01/16  9:13 PM  Result Value Ref Range   Color, Urine YELLOW YELLOW   APPearance CLEAR CLEAR   Specific Gravity, Urine 1.013 1.005 - 1.030   pH 6.0 5.0 - 8.0   Glucose, UA NEGATIVE NEGATIVE mg/dL   Hgb urine dipstick NEGATIVE NEGATIVE   Bilirubin Urine NEGATIVE NEGATIVE   Ketones, ur 20 (A) NEGATIVE mg/dL   Protein, ur NEGATIVE NEGATIVE mg/dL   Nitrite NEGATIVE NEGATIVE   Leukocytes, UA NEGATIVE NEGATIVE  Basic metabolic panel     Status: Abnormal   Collection Time: 12/02/16  6:40 AM  Result Value Ref Range   Sodium 137 135 - 145 mmol/L   Potassium 3.4 (L) 3.5 - 5.1 mmol/L   Chloride 104 101 - 111 mmol/L   CO2 26 22 - 32 mmol/L   Glucose, Bld 93 65 - 99 mg/dL   BUN 5 (L) 6 - 20 mg/dL   Creatinine, Ser 0.51 0.44 - 1.00 mg/dL   Calcium 8.9 8.9 - 10.3 mg/dL   GFR calc non Af Amer >60 >60 mL/min   GFR calc Af Amer >60 >60 mL/min    Comment: (NOTE) The eGFR has been calculated using the CKD EPI equation. This calculation has not been validated in all clinical situations. eGFR's persistently <60 mL/min signify possible Chronic Kidney Disease.  Anion gap 7 5 - 15  CBC     Status: Abnormal   Collection Time: 12/02/16  6:40 AM  Result Value Ref Range   WBC 4.2 4.0 - 10.5 K/uL   RBC 3.43 (L) 3.87 - 5.11 MIL/uL   Hemoglobin 9.9 (L) 12.0 - 15.0 g/dL   HCT  30.2 (L) 36.0 - 46.0 %   MCV 88.0 78.0 - 100.0 fL   MCH 28.9 26.0 - 34.0 pg   MCHC 32.8 30.0 - 36.0 g/dL   RDW 13.7 11.5 - 15.5 %   Platelets 315 150 - 400 K/uL  C-reactive protein     Status: Abnormal   Collection Time: 12/02/16  6:40 AM  Result Value Ref Range   CRP 1.6 (H) <1.0 mg/dL  Protime-INR     Status: None   Collection Time: 12/02/16  7:29 AM  Result Value Ref Range   Prothrombin Time 14.2 11.4 - 15.2 seconds   INR 1.10   Iron and TIBC     Status: Abnormal   Collection Time: 12/02/16  8:01 AM  Result Value Ref Range   Iron 63 28 - 170 ug/dL   TIBC 237 (L) 250 - 450 ug/dL   Saturation Ratios 27 10.4 - 31.8 %   UIBC 174 ug/dL  Ferritin     Status: None   Collection Time: 12/02/16  8:01 AM  Result Value Ref Range   Ferritin 167 11 - 307 ng/mL  Reticulocytes     Status: Abnormal   Collection Time: 12/02/16  8:01 AM  Result Value Ref Range   Retic Ct Pct 1.3 0.4 - 3.1 %   RBC. 3.46 (L) 3.87 - 5.11 MIL/uL   Retic Count, Absolute 45.0 19.0 - 186.0 K/uL    Imaging: No results found.  Assessment/Plan 1. 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  She is NPO and her labs and vitals have been reviewed. Risks and benefits discussed with the patient including, but not limited to bleeding, infection, damage to adjacent structures or low yield requiring additional tests. All of the patient's questions were answered, patient is agreeable to proceed. Consent signed and in chart.  Thank you for this interesting consult.  I greatly enjoyed meeting MILANIE ROSENFIELD and look forward to participating in their care.  A copy of this report was sent to the requesting provider on this date.  Electronically Signed: Henreitta Cea 12/02/2016, 1:58 PM   I spent a total of 40 Minutes    in face to face in clinical consultation, greater than 50% of which was counseling/coordinating care for L2-3 diskitis

## 2016-12-02 NOTE — Progress Notes (Signed)
Called [place] [facility] to request a cd of MRI results. No answer but I did leave a message to call back.

## 2016-12-02 NOTE — Progress Notes (Addendum)
PROGRESS NOTE    Audrey Walter   RDE:[id number]  DOB: 11-Apr-1950  DOA: 12/01/2016 PCP: Melony Overly, MD   Brief Narrative:  Audrey Walter is a 66 y.o. female with medical history significant of nephrolithiasis and back pain presenting from [place] due to abnormal MRI. She has been having trouble for back pain for a few months. She had a kidney infection a couple of months ago in [place].  She has had "bone scans, MRI, xrays galore".  Kidney stone with lithotrypsy 2 weeks ago.   Referred to orthopedics and urology because they weren't sure what was causing her problems.  She had an MRI and had an appointment with orthopedics today who was concerned about osteo and sent her to the ER.     Subjective: Currently not much pain. Has back pain when she walks. No other complaints.  ROS: no complaints of nausea, vomiting, constipation diarrhea, cough, dyspnea or dysuria. No other complaints.   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 - no neurological deficits- NS has reviewed the MRI and feels she can be managed medically - non-tender at L2-L3 but MRI highly suggestive of infection- reviewed with radiology, she has an epidural abscess as well - IR for disc aspiration- hold antibiotics until this is done - ID consulted- - note that she was on and off antibiotics for urinary issues and completed a 5 day course starting after her lithotripsy after 9/7  Active Problems:  Back pain - pain has been "moving" - had nephrolithiasis and had lithotripsy done at [place] on 9/7  - on exam today, she is minimally tender moslty in left sacral area- she and her daughter also state that she has pain in her left hip at times     Normocytic anemia - check anemia panel  Hypokalemia - replaced  DVT prophylaxis: SCDs for today Code Status: full code Family Communication: daughter Disposition Plan: home when stable Consultants:   ID     IR  Phone consult with NS by ER Procedures:    Antimicrobials:  Anti-infectives    None       Objective: Vitals:   12/02/16 0039 12/02/16 0439 12/02/16 0937 12/02/16 1405  BP: (!) 155/76 (!) 143/69 137/64 (!) 143/71  Pulse: 70 67 77 80  Resp: 20 20 20 20   Temp: 98.7 F (37.1 C) 98.4 F (36.9 C) 98.2 F (36.8 C) 98.2 F (36.8 C)  TempSrc: Oral Oral Oral Oral  SpO2: 98% 98% 96% 97%  Weight: 44.5 kg (98 lb 3.2 oz)     Height: 4\' 11"  (1.499 m)      No intake or output data in the 24 hours ending 12/02/16 1501 Filed Weights   12/02/16 0039  Weight: 44.5 kg (98 lb 3.2 oz)    Examination: General exam: Appears comfortable  HEENT: PERRLA, oral mucosa moist, no sclera icterus or thrush Respiratory system: Clear to auscultation. Respiratory effort normal. Cardiovascular system: S1 & S2 heard, RRR.  No murmurs  Gastrointestinal system: Abdomen soft, non-tender, nondistended. Normal bowel sound. No organomegaly Central nervous system: Alert and oriented. No focal neurological deficits. Extremities: No cyanosis, clubbing or edema MSK: mild tenderness in left sacral area Skin: No rashes or ulcers Psychiatry:  Mood & affect appropriate.     Data Reviewed: I have personally reviewed following labs and imaging studies  CBC:  Recent Labs Lab 12/01/16 1814 12/02/16 0640  WBC 5.3 4.2  NEUTROABS 3.9  --  HGB 10.4* 9.9*  HCT 32.0* 30.2*  MCV 88.4 88.0  PLT 352 330   Basic Metabolic Panel:  Recent Labs Lab 12/01/16 1814 12/02/16 0640  NA 135 137  K 3.4* 3.4*  CL 101 104  CO2 25 26  GLUCOSE 118* 93  BUN 8 5*  CREATININE 0.47 0.51  CALCIUM 8.9 8.9   GFR: Estimated Creatinine Clearance: 45.9 mL/min (by C-G formula based on SCr of 0.51 mg/dL). Liver Function Tests:  Recent Labs Lab 12/01/16 1814  AST 17  ALT 10*  ALKPHOS 92  BILITOT 0.4  PROT 6.9  ALBUMIN 3.3*   No results for input(s): LIPASE, AMYLASE in the last 168 hours. No results for  input(s): AMMONIA in the last 168 hours. Coagulation Profile:  Recent Labs Lab 12/02/16 0729  INR 1.10   Cardiac Enzymes: No results for input(s): CKTOTAL, CKMB, CKMBINDEX, TROPONINI in the last 168 hours. BNP (last 3 results) No results for input(s): PROBNP in the last 8760 hours. HbA1C: No results for input(s): HGBA1C in the last 72 hours. CBG: No results for input(s): GLUCAP in the last 168 hours. Lipid Profile: No results for input(s): CHOL, HDL, LDLCALC, TRIG, CHOLHDL, LDLDIRECT in the last 72 hours. Thyroid Function Tests: No results for input(s): TSH, T4TOTAL, FREET4, T3FREE, THYROIDAB in the last 72 hours. Anemia Panel:  Recent Labs  12/02/16 0801  FERRITIN 167  TIBC 237*  IRON 63  RETICCTPCT 1.3   Urine analysis:    Component Value Date/Time   COLORURINE YELLOW 12/01/2016 2113   APPEARANCEUR CLEAR 12/01/2016 2113   LABSPEC 1.013 12/01/2016 2113   PHURINE 6.0 12/01/2016 2113   GLUCOSEU NEGATIVE 12/01/2016 2113   HGBUR NEGATIVE 12/01/2016 2113   Shields NEGATIVE 12/01/2016 2113   KETONESUR 20 (A) 12/01/2016 2113   PROTEINUR NEGATIVE 12/01/2016 2113   NITRITE NEGATIVE 12/01/2016 2113   LEUKOCYTESUR NEGATIVE 12/01/2016 2113   Sepsis Labs: @LABRCNTIP (procalcitonin:4,lacticidven:4) ) Recent Results (from the past 240 hour(s))  Blood culture (routine x 2)     Status: None (Preliminary result)   Collection Time: 12/01/16  6:02 PM  Result Value Ref Range Status   Specimen Description BLOOD LEFT ANTECUBITAL  Final   Special Requests   Final    BOTTLES DRAWN AEROBIC AND ANAEROBIC Blood Culture adequate volume   Culture NO GROWTH < 24 HOURS  Final   Report Status PENDING  Incomplete  Blood culture (routine x 2)     Status: None (Preliminary result)   Collection Time: 12/01/16  6:03 PM  Result Value Ref Range Status   Specimen Description BLOOD RIGHT ANTECUBITAL  Final   Special Requests BOTTLES DRAWN AEROBIC AND ANAEROBIC BCAV  Final   Culture NO GROWTH  < 24 HOURS  Final   Report Status PENDING  Incomplete         Radiology Studies: No results found.    Scheduled Meds: . docusate sodium  100 mg Oral BID   Continuous Infusions: . lactated ringers 75 mL/hr at 12/02/16 0131     LOS: 1 day    Time spent in minutes: 35    Debbe Odea, MD [facility] Pager: [email or web address] Password [email or web address] 12/02/2016, 3:01 PM

## 2016-12-02 NOTE — Consult Note (Signed)
Patient currently in IR getting disc aspiration.  I obtained a copy of the MRI report from radiology.  "Severe L2-3 diskitis and osteomyelitis.Marland KitchenMarland KitchenMarland KitchenVentral epidural phlegmon.  Right psoas edema.  Among other findings.    Full consult tomorrow.  I will start empiric vancomycin and ceftriaxone, narrow as appropriate  She will need long term IV antibiotics.   Scharlene Gloss, MD

## 2016-12-03 LAB — FOLATE: FOLATE: 13.3 ng/mL (ref >5.9)

## 2016-12-03 LAB — VITAMIN B12: Vitamin B-12: 919 pg/mL — ABNORMAL HIGH (ref 180–914)

## 2016-12-03 MED ORDER — HYDROCODONE-ACETAMINOPHEN 5-325 MG PO TABS
1.0000 | ORAL_TABLET | ORAL | Status: DC | PRN
  Administered 2016-12-03 (×3): 1 via ORAL
  Filled 2016-12-03 (×3): qty 1

## 2016-12-03 MED ORDER — DICLOFENAC SODIUM 1 % TD GEL
2.0000 g | Freq: Four times a day (QID) | TRANSDERMAL | Status: DC
Start: 2016-12-03 — End: 2016-12-04
  Administered 2016-12-03 – 2016-12-04 (×7): 2 g via TOPICAL
  Filled 2016-12-03: qty 100

## 2016-12-03 MED ORDER — ENOXAPARIN SODIUM 30 MG/0.3ML ~~LOC~~ SOLN
30.0000 mg | SUBCUTANEOUS | Status: DC
  Administered 2016-12-03: 30 mg via SUBCUTANEOUS
  Filled 2016-12-03: qty 0.3

## 2016-12-03 NOTE — Progress Notes (Signed)
Spoke with Rn regarding PICC line placement possibly 12-04-16.  No plans for d/c home today and current PIV working.

## 2016-12-03 NOTE — Consult Note (Signed)
[place] for Infectious Disease       Reason for Consult: L2-3 discitis/osteomyelitis    Referring Physician: Dr. Wynelle Cleveland  Principal Problem:   Vertebral osteomyelitis [facility]) Active Problems:   Hyperglycemia   Normocytic anemia   Left low back pain   Hypokalemia   . docusate sodium  100 mg Oral BID    Recommendations: Continue ceftriaxone pending sensitivities Stop vancomycin  picc line OPAT consult when organism known  Assessment: She has discitis/ostemyelitis and growing GNR from culture done yesterday.  Will need prolonged IV antibiotics   Antibiotics: Ceftriaxone day 2 Vancomycin day 2  HPI: Audrey Walter is a 66 y.o. female with about a 2 month history of back pain and history of nephrolithiasis who had an MRI about 3 days ago c/w discitis/osteomyelitis.  She has been on several courses of oral antibiotics and IM Rocephin for presumed urinary source.  Some subjective fever.  No associated n/v/d.  MRI with some psoas fluid as well.  CRP 1.6, ESR 45.  Quantiferon Gold sent by [facility].  MRI report obtained from radiology.     Review of Systems:  Constitutional: negative for fevers and chills Gastrointestinal: negative for diarrhea Integument/breast: negative for rash Musculoskeletal: positive for back pain, negative for myalgias All other systems reviewed and are negative    Past Medical History:  Diagnosis Date  . Hiatal hernia   . Inguinal hernia   . Mitral valve prolapse   . Nephrolithiasis     Social History  Substance Use Topics  . Smoking status: Never Smoker  . Smokeless tobacco: Never Used  . Alcohol use No    FH: + cardiac disease  No Known Allergies  Physical Exam: Constitutional: in no apparent distress and alert  Vitals:   12/03/16 0545 12/03/16 0905  BP: (!) 141/72 (!) 142/71  Pulse: 79 80  Resp: 18 20  Temp: 98.6 F (37 C) 98.3 F (36.8 C)  SpO2: 95% 96%   EYES: anicteric ENMT: no thrush Cardiovascular: Cor  RRR Respiratory: CTA B; normal respiratory effort GI: Bowel sounds are normal, liver is not enlarged, spleen is not enlarged Musculoskeletal: no pedal edema noted Skin: negatives: no rash Hematologic: no cervical lad  Lab Results  Component Value Date   WBC 4.2 12/02/2016   HGB 9.9 (L) 12/02/2016   HCT 30.2 (L) 12/02/2016   MCV 88.0 12/02/2016   PLT 315 12/02/2016    Lab Results  Component Value Date   CREATININE 0.51 12/02/2016   BUN 5 (L) 12/02/2016   NA 137 12/02/2016   K 3.4 (L) 12/02/2016   CL 104 12/02/2016   CO2 26 12/02/2016    Lab Results  Component Value Date   ALT 10 (L) 12/01/2016   AST 17 12/01/2016   ALKPHOS 92 12/01/2016     Microbiology: Recent Results (from the past 240 hour(s))  Blood culture (routine x 2)     Status: None (Preliminary result)   Collection Time: 12/01/16  6:02 PM  Result Value Ref Range Status   Specimen Description BLOOD LEFT ANTECUBITAL  Final   Special Requests   Final    BOTTLES DRAWN AEROBIC AND ANAEROBIC Blood Culture adequate volume   Culture NO GROWTH 2 DAYS  Final   Report Status PENDING  Incomplete  Blood culture (routine x 2)     Status: None (Preliminary result)   Collection Time: 12/01/16  6:03 PM  Result Value Ref Range Status   Specimen Description BLOOD RIGHT ANTECUBITAL  Final   Special Requests BOTTLES DRAWN AEROBIC AND ANAEROBIC BCAV  Final   Culture NO GROWTH 2 DAYS  Final   Report Status PENDING  Incomplete  Body fluid culture     Status: None (Preliminary result)   Collection Time: 12/02/16  4:06 PM  Result Value Ref Range Status   Specimen Description FLUID  Final   Special Requests INTERVERTEBRAB DISC  Final   Gram Stain   Final    RARE WBC PRESENT,BOTH PMN AND MONONUCLEAR NO ORGANISMS SEEN    Culture   Final    FEW GRAM NEGATIVE RODS CRITICAL RESULT CALLED TO, READ BACK BY AND VERIFIED WITH: Barnie Del AT 0901 12/03/16 BY L BENFIELD CONCERNING GROWTH ON CULTURE    Report Status PENDING   Incomplete    Scharlene Gloss, [facility] [email or web address] [phone] pager  [phone] cell 12/03/2016, 11:08 AM

## 2016-12-03 NOTE — Progress Notes (Signed)
PROGRESS NOTE    Audrey Walter   LOV:[id number]  DOB: [birth year]  DOA: 12/01/2016 PCP: Melony Overly, MD   Brief Narrative:  Audrey Walter is a 66 y.o. female with medical history significant of nephrolithiasis and back pain presenting from [place] due to abnormal MRI. She has been having trouble for back pain for a few months. She had a kidney infection a couple of months ago in [place].  She has had "bone scans, MRI, xrays galore".  She had a kidney stone with lithotrypsy 2 weeks ago.   Referred to orthopedics and urology because they weren't sure what was causing her problems.  She had an MRI and had an appointment with orthopedics who was concerned about osteo and sent her to the ER.    Of note that she was on and off antibiotics for urinary issues and completed a 5 day course starting after her lithotripsy after 9/7  Subjective: Mild back pain across lower back. No other complaints. Able to ambulate.   ROS: no complaints of nausea, vomiting, constipation diarrhea, cough, dyspnea or dysuria. No other complaints.   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 - no neurological deficits- NS has reviewed the MRI and feels she can be managed medically - non-tender at L2-L3 but MRI highly suggestive of infection- reviewed with radiology, she has an epidural abscess as well -9/21 IR for disc aspiration - 9/ 22- aspirate shows gr neg rods- I suspect she may have been bacteremic with her renal stones which she had recently - Place PICC for long term antibiotics - ID consulted- recommend Rocephin   Active Problems:  Back pain - pain has been "moving" - had nephrolithiasis and had lithotripsy done at [facility] on 9/7  - on exam today, she is tender across lower back- called by RN as patient requested Hydrocodone instead of Morphine which makes her feel funny - have ordered Voltaren gel and PRN Hydrocodone     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  Hypokalemia - replaced  DVT prophylaxis: Lovenox Code Status: full code Family Communication: daughter Disposition Plan: home when stable Consultants:   ID   IR  Phone consult with NS by ER Procedures:   9/21- aspiration L2-L3 Antimicrobials:  Anti-infectives    Start     Dose/Rate Route Frequency Ordered Stop   12/03/16 0600  vancomycin (VANCOCIN) 500 mg in sodium chloride 0.9 % 100 mL IVPB  Status:  Discontinued     500 mg 100 mL/hr over 60 Minutes Intravenous Every 12 hours 12/02/16 1649 12/03/16 1109   12/02/16 1800  vancomycin (VANCOCIN) 500 mg in sodium chloride 0.9 % 100 mL IVPB  Status:  Discontinued     500 mg 100 mL/hr over 60 Minutes Intravenous Every 12 hours 12/02/16 1646 12/02/16 1649   12/02/16 1800  vancomycin (VANCOCIN) IVPB 750 mg/150 ml premix     750 mg 150 mL/hr over 60 Minutes Intravenous  Once 12/02/16 1649 12/02/16 1820   12/02/16 1700  cefTRIAXone (ROCEPHIN) 2 g in dextrose 5 % 50 mL IVPB     2 g 100 mL/hr over 30 Minutes Intravenous Every 24 hours 12/02/16 1602         Objective: Vitals:   12/03/16 0052 12/03/16 0545 12/03/16 0905 12/03/16 1327  BP: 138/73 (!) 141/72 (!) 142/71 118/65  Pulse: 79 79 80 94  Resp: 18 18 20 16   Temp: 98.3 F (36.8  C) 98.6 F (37 C) 98.3 F (36.8 C) 98 F (36.7 C)  TempSrc: Oral Oral Oral Oral  SpO2: 96% 95% 96% 98%  Weight:      Height:        Intake/Output Summary (Last 24 hours) at 12/03/16 1449 Last data filed at 12/03/16 1329  Gross per 24 hour  Intake             1705 ml  Output                0 ml  Net             1705 ml   Filed Weights   12/02/16 0039  Weight: 44.5 kg (98 lb 3.2 oz)    Examination: General exam: Appears comfortable  HEENT: PERRLA, oral mucosa moist, no sclera icterus or thrush Respiratory system: Clear to auscultation. Respiratory effort normal. Cardiovascular system: S1 & S2 heard,  No murmurs  Gastrointestinal  system: Abdomen soft, non-tender, nondistended. Normal bowel sound. No organomegaly Central nervous system: Alert and oriented. No focal neurological deficits. Extremities: No cyanosis, clubbing or edema MSK: mildly tender across lower back Skin: No rashes or ulcers Psychiatry:  Mood & affect appropriate.      Data Reviewed: I have personally reviewed following labs and imaging studies  CBC:  Recent Labs Lab 12/01/16 1814 12/02/16 0640  WBC 5.3 4.2  NEUTROABS 3.9  --   HGB 10.4* 9.9*  HCT 32.0* 30.2*  MCV 88.4 88.0  PLT 352 517   Basic Metabolic Panel:  Recent Labs Lab 12/01/16 1814 12/02/16 0640  NA 135 137  K 3.4* 3.4*  CL 101 104  CO2 25 26  GLUCOSE 118* 93  BUN 8 5*  CREATININE 0.47 0.51  CALCIUM 8.9 8.9   GFR: Estimated Creatinine Clearance: 45.9 mL/min (by C-G formula based on SCr of 0.51 mg/dL). Liver Function Tests:  Recent Labs Lab 12/01/16 1814  AST 17  ALT 10*  ALKPHOS 92  BILITOT 0.4  PROT 6.9  ALBUMIN 3.3*   No results for input(s): LIPASE, AMYLASE in the last 168 hours. No results for input(s): AMMONIA in the last 168 hours. Coagulation Profile:  Recent Labs Lab 12/02/16 0729  INR 1.10   Cardiac Enzymes: No results for input(s): CKTOTAL, CKMB, CKMBINDEX, TROPONINI in the last 168 hours. BNP (last 3 results) No results for input(s): PROBNP in the last 8760 hours. HbA1C: No results for input(s): HGBA1C in the last 72 hours. CBG: No results for input(s): GLUCAP in the last 168 hours. Lipid Profile: No results for input(s): CHOL, HDL, LDLCALC, TRIG, CHOLHDL, LDLDIRECT in the last 72 hours. Thyroid Function Tests: No results for input(s): TSH, T4TOTAL, FREET4, T3FREE, THYROIDAB in the last 72 hours. Anemia Panel:  Recent Labs  12/02/16 0801 12/03/16 0556  VITAMINB12  --  919*  FOLATE  --  13.3  FERRITIN 167  --   TIBC 237*  --   IRON 63  --   RETICCTPCT 1.3  --    Urine analysis:    Component Value Date/Time    COLORURINE YELLOW 12/01/2016 2113   APPEARANCEUR CLEAR 12/01/2016 2113   LABSPEC 1.013 12/01/2016 2113   PHURINE 6.0 12/01/2016 2113   GLUCOSEU NEGATIVE 12/01/2016 2113   HGBUR NEGATIVE 12/01/2016 2113   BILIRUBINUR NEGATIVE 12/01/2016 2113   KETONESUR 20 (A) 12/01/2016 2113   PROTEINUR NEGATIVE 12/01/2016 2113   NITRITE NEGATIVE 12/01/2016 2113   LEUKOCYTESUR NEGATIVE 12/01/2016 2113   Sepsis Labs: @LABRCNTIP (procalcitonin:4,lacticidven:4) )  Recent Results (from the past 240 hour(s))  Blood culture (routine x 2)     Status: None (Preliminary result)   Collection Time: 12/01/16  6:02 PM  Result Value Ref Range Status   Specimen Description BLOOD LEFT ANTECUBITAL  Final   Special Requests   Final    BOTTLES DRAWN AEROBIC AND ANAEROBIC Blood Culture adequate volume   Culture NO GROWTH 2 DAYS  Final   Report Status PENDING  Incomplete  Blood culture (routine x 2)     Status: None (Preliminary result)   Collection Time: 12/01/16  6:03 PM  Result Value Ref Range Status   Specimen Description BLOOD RIGHT ANTECUBITAL  Final   Special Requests BOTTLES DRAWN AEROBIC AND ANAEROBIC BCAV  Final   Culture NO GROWTH 2 DAYS  Final   Report Status PENDING  Incomplete  Body fluid culture     Status: None (Preliminary result)   Collection Time: 12/02/16  4:06 PM  Result Value Ref Range Status   Specimen Description FLUID  Final   Special Requests INTERVERTEBRAB DISC  Final   Gram Stain   Final    RARE WBC PRESENT,BOTH PMN AND MONONUCLEAR NO ORGANISMS SEEN    Culture   Final    FEW GRAM NEGATIVE RODS CRITICAL RESULT CALLED TO, READ BACK BY AND VERIFIED WITH: Barnie Del AT 0901 12/03/16 BY L BENFIELD CONCERNING GROWTH ON CULTURE    Report Status PENDING  Incomplete         Radiology Studies: No results found.    Scheduled Meds: . diclofenac sodium  2 g Topical QID  . docusate sodium  100 mg Oral BID   Continuous Infusions: . cefTRIAXone (ROCEPHIN)  IV Stopped (12/02/16  1720)  . lactated ringers Stopped (12/02/16 1719)     LOS: 2 days    Time spent in minutes: 35    Debbe Odea, MD [facility] Pager: [email or web address] Password [email or web address] 12/03/2016, 2:49 PM

## 2016-12-04 DIAGNOSIS — R739 Hyperglycemia, unspecified: Secondary | ICD-10-CM

## 2016-12-04 DIAGNOSIS — G8929 Other chronic pain: Secondary | ICD-10-CM

## 2016-12-04 MED ORDER — IBUPROFEN 800 MG PO TABS: 400.0000 mg | ORAL_TABLET | Freq: Four times a day (QID) | ORAL | 2 refills | Status: AC | PRN

## 2016-12-04 MED ORDER — HEPARIN SOD (PORK) LOCK FLUSH 100 UNIT/ML IV SOLN
250.0000 [IU] | INTRAVENOUS | Status: DC | PRN
  Administered 2016-12-04: 250 [IU]
  Filled 2016-12-04 (×2): qty 3

## 2016-12-04 MED ORDER — ACETAMINOPHEN 325 MG PO TABS: 650.0000 mg | ORAL_TABLET | Freq: Four times a day (QID) | ORAL | Status: AC | PRN

## 2016-12-04 MED ORDER — DICLOFENAC SODIUM 1 % TD GEL: 2.0000 g | Freq: Four times a day (QID) | TRANSDERMAL | 0 refills | Status: AC

## 2016-12-04 MED ORDER — SODIUM CHLORIDE 0.9% FLUSH
10.0000 mL | INTRAVENOUS | Status: DC | PRN
  Administered 2016-12-04: 10 mL
  Filled 2016-12-04: qty 40

## 2016-12-04 MED ORDER — CEFTRIAXONE IV (FOR PTA / DISCHARGE USE ONLY): 2.0000 g | INTRAVENOUS | 0 refills | Status: AC

## 2016-12-04 MED ORDER — HEPARIN SOD (PORK) LOCK FLUSH 100 UNIT/ML IV SOLN
250.0000 [IU] | Freq: Every day | INTRAVENOUS | Status: DC
  Filled 2016-12-04: qty 2.5

## 2016-12-04 NOTE — Progress Notes (Signed)
Peripherally Inserted Central Catheter/Midline Placement  The IV Nurse has discussed with the patient and/or persons authorized to consent for the patient, the purpose of this procedure and the potential benefits and risks involved with this procedure.  The benefits include less needle sticks, lab draws from the catheter, and the patient may be discharged home with the catheter. Risks include, but not limited to, infection, bleeding, blood clot (thrombus formation), and puncture of an artery; nerve damage and irregular heartbeat and possibility to perform a PICC exchange if needed/ordered by physician.  Alternatives to this procedure were also discussed.  Bard Power PICC patient education guide, fact sheet on infection prevention and patient information card has been provided to patient /or left at bedside.    PICC/Midline Placement Documentation  PICC Single Lumen 12/04/16 PICC Right Brachial 33 cm 0 cm (Active)  Indication for Insertion or Continuance of Line Home intravenous therapies (PICC only) 12/04/2016 11:18 AM  Exposed Catheter (cm) 0 cm 12/04/2016 11:18 AM  Site Assessment Clean;Dry;Intact 12/04/2016 11:18 AM  Line Status Flushed;Saline locked;Blood return noted 12/04/2016 11:18 AM  Dressing Type Transparent 12/04/2016 11:18 AM  Dressing Status Clean;Dry;Intact;Antimicrobial disc in place 12/04/2016 11:18 AM  Line Care Connections checked and tightened 12/04/2016 11:18 AM  Line Adjustment (NICU/IV Team Only) No 12/04/2016 11:18 AM  Dressing Intervention New dressing 12/04/2016 11:18 AM  Dressing Change Due 12/11/16 12/04/2016 11:18 AM       Rolena Infante 12/04/2016, 11:19 AM

## 2016-12-04 NOTE — Care Management Note (Signed)
Case Management Note Marvetta Gibbons RN, BSN Unit 4E-Case Manager-- [facility] weekend coverage [phone]  Patient Details  Name: Audrey Walter MRN: [id number] Date of Birth: Jul 13, 1950  Subjective/Objective:   Pt admitted with vertebral osteomyelitis                  Action/Plan: PTA pt lived at home- referral received for home IV abx needs- [facility] order placed- spoke with pt and her sister at bedside- choice offered for [facility] agency with list provided for [facility]- per pt she selected [facility] for services- no DME needs identified -her sister will be available to assist her at home with iv abx. -  referral for [facility] for IV abx called to [facility] with [facility]- referral accepted- pt  Has PICC- will need to get today's does here prior to discharge- with start of care for [facility] tomorrow 9/24- will need OPAT orders for IV abx-   Expected Discharge Date:  12/05/16               Expected Discharge Plan:  [place]  In-House Referral:  NA  Discharge planning Services  CM Consult  Post Acute Care Choice:  Home Health Choice offered to:  Patient  DME Arranged:  N/A DME Agency:  NA  HH Arranged:  RN [place] Agency:  [place]  Status of Service:  Completed, signed off  If discussed at [place] of Stay Meetings, dates discussed:    Discharge Disposition: home/home health   Additional Comments:  Dawayne Patricia, RN 12/04/2016, 12:11 PM

## 2016-12-04 NOTE — Progress Notes (Signed)
Called and left voicemail to Case Management. Reminded of consult for home health.

## 2016-12-04 NOTE — Progress Notes (Signed)
Reviewed discharge instructions and home health follow up with patient.  Patient taken down by wheelchair. All of belongings are being taken with family.

## 2016-12-04 NOTE — Progress Notes (Signed)
PHARMACY CONSULT NOTE FOR:  OUTPATIENT  PARENTERAL ANTIBIOTIC THERAPY (OPAT)  Indication: Osteomyelitis Regimen: 2 gram IV every 24 hours  End date: 01/12/2017  IV antibiotic discharge orders are pended. To discharging provider:  please sign these orders via discharge navigator,  Select New Orders & click on the button choice - Manage This Unsigned Work.     Thank you for allowing pharmacy to be a part of this patient's care.   Charlene Brooke, PharmD PGY1 Pharmacy Resident Phone: [phone] After 3:30PM please call [place] [phone] 12/04/2016, 2:41 PM

## 2016-12-04 NOTE — Progress Notes (Signed)
Dr. Linus Salmons agrees with PICC placement okay today due to negative blood cultures.

## 2016-12-04 NOTE — Discharge Summary (Signed)
Physician Discharge Summary  Audrey Walter YBO:[id number] DOB: 1950/10/06 DOA: 12/01/2016  PCP: Melony Overly, MD  Admit date: 12/01/2016 Discharge date: 12/04/2016  Admitted From: home Disposition:  home   Recommendations for Outpatient Follow-up:  1. F/u with ID  Home Health:  ordered  Discharge Condition:  stable   CODE STATUS:  Full code   Consultations: Consultants:   ID   IR  Phone consult with NS by ER   Discharge Diagnoses:  Principal Problem:   Vertebral osteomyelitis ([place]) Active Problems:   Hyperglycemia   Normocytic anemia   Left low back pain   Hypokalemia    Subjective: Pain in back is controlled today. No new complaints.   Brief Summary: Audrey Walter is a 66 y.o.femalewith medical history significant of nephrolithiasis and back pain presenting from [place] due to abnormal MRI. She has been having trouble for back pain for a few months.She had a kidney infection a couple of months ago in [place]. She has had "bone scans, MRI, xrays galore". She had a kidney stone with lithotrypsy 2 weeks ago. Referred to orthopedics and urology because they weren't sure what was causing her problems. She had an MRI and had an appointment with orthopedics who was concerned about osteo and sent her to the ER.   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 - no neurological deficits- NS has reviewed the MRI and feels she can be managed medically - non-tender at L2-L3 but MRI highly suggestive of infection- reviewed with radiology, she has an epidural abscess as well -9/21 IR for disc aspiration -aspirate shows e coli sensitive to Ceftriaxone- I suspect she may have been bacteremic with her renal stones which she had recently - Placed PICC for long term antibiotics -  ID  assistance appreciated she will f/u with ID at outpt  Active Problems:  Back pain - no pain or tenderness at level of discitis and osteo - pain has been "moving" - had nephrolithiasis and had lithotripsy done at [place] on 9/7   -9/22  - tender across lower back-  have ordered Voltaren gel which appears to be helping     Normocytic anemia - check anemia panel- consistent with AOCD  Hypokalemia - replaced   Discharge Instructions  Discharge Instructions    Diet - low sodium heart healthy    Complete by:  As directed    Increase activity slowly    Complete by:  As directed      Allergies as of 12/04/2016   No Known Allergies     Medication List    TAKE these medications   acetaminophen 325 MG tablet Commonly known as:  TYLENOL Take 2 tablets (650 mg total) by mouth every 6 (six) hours as needed for mild pain (or Fever >/= 101).   diclofenac sodium 1 % Gel Commonly known as:  VOLTAREN Apply 2 g topically 4 (four) times daily.   ibuprofen 800 MG tablet Commonly known as:  ADVIL,MOTRIN Take 0.5 tablets (400 mg total) by mouth every 6 (six) hours as needed. With food What changed:  how much to take  when to take this  reasons to take this   traMADol 50 MG tablet Commonly known as:  ULTRAM Take 50 mg by mouth every 6 (six) hours as needed for pain.  Discharge Care Instructions        Start     Ordered   12/04/16 0000  acetaminophen (TYLENOL) 325 MG tablet  Every 6 hours PRN     12/04/16 1403   12/04/16 0000  diclofenac sodium (VOLTAREN) 1 % GEL  4 times daily     12/04/16 1403   12/04/16 0000  ibuprofen (ADVIL,MOTRIN) 800 MG tablet  Every 6 hours PRN     12/04/16 1403   12/04/16 0000  Increase activity slowly     12/04/16 1403   12/04/16 0000  Diet - low sodium heart healthy     12/04/16 1403     Follow-up Information    [facility] Follow up.   Why:  HHRN arranged for IV abx- start of care date 9/24 Contact  information: [place] [address] [phone]          No Known Allergies   Procedures/Studies:  9/21- aspiration L2-L3  No results found.     Discharge Exam: Vitals:   12/04/16 0518 12/04/16 1000  BP: 136/78 (!) 151/76  Pulse: 69 80  Resp: 20 16  Temp: 98.8 F (37.1 C) 98.3 F (36.8 C)  SpO2: 97% 99%   Vitals:   12/03/16 2100 12/04/16 0114 12/04/16 0518 12/04/16 1000  BP: 129/71 133/66 136/78 (!) 151/76  Pulse: 73 77 69 80  Resp: 20 20 20 16   Temp: 98.9 F (37.2 C) 98.9 F (37.2 C) 98.8 F (37.1 C) 98.3 F (36.8 C)  TempSrc: Oral Oral Oral Oral  SpO2: 96% 96% 97% 99%  Weight:      Height:        General: Pt is alert, awake, not in acute distress Cardiovascular: RRR, S1/S2 +, no rubs, no gallops Respiratory: CTA bilaterally, no wheezing, no rhonchi Abdominal: Soft, NT, ND, bowel sounds + Extremities: no edema, no cyanosis    The results of significant diagnostics from this hospitalization (including imaging, microbiology, ancillary and laboratory) are listed below for reference.     Microbiology: Recent Results (from the past 240 hour(s))  Blood culture (routine x 2)     Status: None (Preliminary result)   Collection Time: 12/01/16  6:02 PM  Result Value Ref Range Status   Specimen Description BLOOD LEFT ANTECUBITAL  Final   Special Requests   Final    BOTTLES DRAWN AEROBIC AND ANAEROBIC Blood Culture adequate volume   Culture NO GROWTH 2 DAYS  Final   Report Status PENDING  Incomplete  Blood culture (routine x 2)     Status: None (Preliminary result)   Collection Time: 12/01/16  6:03 PM  Result Value Ref Range Status   Specimen Description BLOOD RIGHT ANTECUBITAL  Final   Special Requests BOTTLES DRAWN AEROBIC AND ANAEROBIC BCAV  Final   Culture NO GROWTH 2 DAYS  Final   Report Status PENDING  Incomplete  Body fluid culture     Status: None (Preliminary result)   Collection Time: 12/02/16  4:06 PM  Result Value Ref  Range Status   Specimen Description FLUID  Final   Special Requests INTERVERTEBRAB DISC  Final   Gram Stain   Final    RARE WBC PRESENT,BOTH PMN AND MONONUCLEAR NO ORGANISMS SEEN    Culture   Final    FEW ESCHERICHIA COLI CRITICAL RESULT CALLED TO, READ BACK BY AND VERIFIED WITH: Barnie Del AT 0901 12/03/16 BY L BENFIELD CONCERNING GROWTH ON CULTURE    Report Status PENDING  Incomplete   Organism ID, Bacteria ESCHERICHIA COLI  Final      Susceptibility   Escherichia coli - MIC*    AMPICILLIN 8 SENSITIVE Sensitive     CEFAZOLIN <=4 SENSITIVE Sensitive     CEFEPIME <=1 SENSITIVE Sensitive     CEFTAZIDIME <=1 SENSITIVE Sensitive     CEFTRIAXONE <=1 SENSITIVE Sensitive     CIPROFLOXACIN <=0.25 SENSITIVE Sensitive     GENTAMICIN <=1 SENSITIVE Sensitive     IMIPENEM <=0.25 SENSITIVE Sensitive     TRIMETH/SULFA <=20 SENSITIVE Sensitive     AMPICILLIN/SULBACTAM 4 SENSITIVE Sensitive     PIP/TAZO <=4 SENSITIVE Sensitive     Extended ESBL NEGATIVE Sensitive     * FEW ESCHERICHIA COLI     Labs: BNP (last 3 results) No results for input(s): BNP in the last 8760 hours. Basic Metabolic Panel:  Recent Labs Lab 12/01/16 1814 12/02/16 0640  NA 135 137  K 3.4* 3.4*  CL 101 104  CO2 25 26  GLUCOSE 118* 93  BUN 8 5*  CREATININE 0.47 0.51  CALCIUM 8.9 8.9   Liver Function Tests:  Recent Labs Lab 12/01/16 1814  AST 17  ALT 10*  ALKPHOS 92  BILITOT 0.4  PROT 6.9  ALBUMIN 3.3*   No results for input(s): LIPASE, AMYLASE in the last 168 hours. No results for input(s): AMMONIA in the last 168 hours. CBC:  Recent Labs Lab 12/01/16 1814 12/02/16 0640  WBC 5.3 4.2  NEUTROABS 3.9  --   HGB 10.4* 9.9*  HCT 32.0* 30.2*  MCV 88.4 88.0  PLT 352 315   Cardiac Enzymes: No results for input(s): CKTOTAL, CKMB, CKMBINDEX, TROPONINI in the last 168 hours. BNP: Invalid input(s): POCBNP CBG: No results for input(s): GLUCAP in the last 168 hours. D-Dimer No results for  input(s): DDIMER in the last 72 hours. Hgb A1c No results for input(s): HGBA1C in the last 72 hours. Lipid Profile No results for input(s): CHOL, HDL, LDLCALC, TRIG, CHOLHDL, LDLDIRECT in the last 72 hours. Thyroid function studies No results for input(s): TSH, T4TOTAL, T3FREE, THYROIDAB in the last 72 hours.  Invalid input(s): FREET3 Anemia work up  Recent Labs  12/02/16 0801 12/03/16 0556  VITAMINB12  --  919*  FOLATE  --  13.3  FERRITIN 167  --   TIBC 237*  --   IRON 63  --   RETICCTPCT 1.3  --    Urinalysis    Component Value Date/Time   COLORURINE YELLOW 12/01/2016 2113   APPEARANCEUR CLEAR 12/01/2016 2113   LABSPEC 1.013 12/01/2016 2113   PHURINE 6.0 12/01/2016 2113   GLUCOSEU NEGATIVE 12/01/2016 2113   HGBUR NEGATIVE 12/01/2016 2113   [place] NEGATIVE 12/01/2016 2113   KETONESUR 20 (A) 12/01/2016 2113   PROTEINUR NEGATIVE 12/01/2016 2113   NITRITE NEGATIVE 12/01/2016 2113   LEUKOCYTESUR NEGATIVE 12/01/2016 2113   Sepsis Labs Invalid input(s): PROCALCITONIN,  WBC,  LACTICIDVEN Microbiology Recent Results (from the past 240 hour(s))  Blood culture (routine x 2)     Status: None (Preliminary result)   Collection Time: 12/01/16  6:02 PM  Result Value Ref Range Status   Specimen Description BLOOD LEFT ANTECUBITAL  Final   Special Requests   Final    BOTTLES DRAWN AEROBIC AND ANAEROBIC Blood Culture adequate volume   Culture NO GROWTH 2 DAYS  Final   Report Status PENDING  Incomplete  Blood culture (routine x 2)     Status: None (Preliminary result)   Collection Time: 12/01/16  6:03 PM  Result Value Ref Range Status   Specimen Description BLOOD RIGHT ANTECUBITAL  Final   Special Requests BOTTLES DRAWN AEROBIC AND ANAEROBIC BCAV  Final   Culture NO GROWTH 2 DAYS  Final   Report Status PENDING  Incomplete  Body fluid culture     Status: None (Preliminary result)   Collection Time: 12/02/16  4:06 PM  Result Value Ref Range Status   Specimen Description  FLUID  Final   Special Requests INTERVERTEBRAB DISC  Final   Gram Stain   Final    RARE WBC PRESENT,BOTH PMN AND MONONUCLEAR NO ORGANISMS SEEN    Culture   Final    FEW ESCHERICHIA COLI CRITICAL RESULT CALLED TO, READ BACK BY AND VERIFIED WITH: Barnie Del AT 0901 12/03/16 BY L BENFIELD CONCERNING GROWTH ON CULTURE    Report Status PENDING  Incomplete   Organism ID, Bacteria ESCHERICHIA COLI  Final      Susceptibility   Escherichia coli - MIC*    AMPICILLIN 8 SENSITIVE Sensitive     CEFAZOLIN <=4 SENSITIVE Sensitive     CEFEPIME <=1 SENSITIVE Sensitive     CEFTAZIDIME <=1 SENSITIVE Sensitive     CEFTRIAXONE <=1 SENSITIVE Sensitive     CIPROFLOXACIN <=0.25 SENSITIVE Sensitive     GENTAMICIN <=1 SENSITIVE Sensitive     IMIPENEM <=0.25 SENSITIVE Sensitive     TRIMETH/SULFA <=20 SENSITIVE Sensitive     AMPICILLIN/SULBACTAM 4 SENSITIVE Sensitive     PIP/TAZO <=4 SENSITIVE Sensitive     Extended ESBL NEGATIVE Sensitive     * FEW ESCHERICHIA COLI     Time coordinating discharge: Over 30 minutes  SIGNED:   Debbe Odea, MD  [facility] 12/04/2016, 2:05 PM Pager   If 7PM-7AM, please contact night-coverage [email or web address] Password [email or web address]

## 2016-12-06 ENCOUNTER — Encounter (HOSPITAL_COMMUNITY): Payer: Self-pay | Admitting: Interventional Radiology

## 2016-12-06 LAB — QUANTIFERON IN TUBE
QFT TB AG MINUS NIL VALUE: 0 IU/mL
QUANTIFERON MITOGEN VALUE: 0.13 IU/mL
QUANTIFERON NIL VALUE: 0.03 [IU]/mL
QUANTIFERON TB AG VALUE: 0.03 IU/mL
QUANTIFERON TB GOLD: UNDETERMINED

## 2016-12-06 LAB — QUANTIFERON TB GOLD ASSAY (BLOOD)

## 2016-12-06 LAB — BODY FLUID CULTURE

## 2016-12-06 LAB — CULTURE, BLOOD (ROUTINE X 2)
Culture: NO GROWTH
Culture: NO GROWTH
Special Requests: ADEQUATE

## 2016-12-08 LAB — ACID FAST SMEAR (AFB, MYCOBACTERIA): Acid Fast Smear: NEGATIVE

## 2016-12-12 ENCOUNTER — Encounter: Payer: Self-pay | Admitting: Internal Medicine

## 2016-12-14 ENCOUNTER — Other Ambulatory Visit: Payer: Self-pay | Admitting: Pharmacist

## 2016-12-19 ENCOUNTER — Encounter: Payer: Self-pay | Admitting: Internal Medicine

## 2016-12-21 ENCOUNTER — Other Ambulatory Visit: Payer: Self-pay | Admitting: Pharmacist

## 2016-12-22 ENCOUNTER — Encounter: Payer: Self-pay | Admitting: Internal Medicine

## 2016-12-22 ENCOUNTER — Ambulatory Visit (INDEPENDENT_AMBULATORY_CARE_PROVIDER_SITE_OTHER): Payer: BC Managed Care – PPO | Admitting: Internal Medicine

## 2016-12-22 DIAGNOSIS — M4620 Osteomyelitis of vertebra, site unspecified: Secondary | ICD-10-CM

## 2016-12-22 DIAGNOSIS — Z5181 Encounter for therapeutic drug level monitoring: Secondary | ICD-10-CM | POA: Diagnosis not present

## 2016-12-22 DIAGNOSIS — Z452 Encounter for adjustment and management of vascular access device: Secondary | ICD-10-CM

## 2016-12-22 NOTE — Progress Notes (Signed)
   Subjective:    Patient ID: Audrey Walter, female    DOB: 1950/07/21, 66 y.o.   MRN: [id number]  HPI Here for follow up of discitis/osteomyelitis.   Audrey Walter is a 66 y.o. female with about a 2 month history of back pain and history of nephrolithiasis who had an MRI c/w discitis/osteomyelitis.  She has been on several courses of oral antibiotics and IM Rocephin for presumed urinary source.  MRI with some psoas fluid as well.  Baseline CRP 1.6, ESR 45. Her culture grew E coli and I put her on ceftriaxone for 6 weeks through November 2nd.  Today she is feeling better, more active and no issues.  No associated n/v/d.  No rash.  No issues with picc line.     Review of Systems  Constitutional: Negative for fatigue.  Gastrointestinal: Negative for diarrhea and nausea.  Skin: Negative for rash.       Objective:   Physical Exam  Constitutional: She appears well-developed and well-nourished. No distress.  Eyes: No scleral icterus.  Cardiovascular: Normal rate, regular rhythm and normal heart sounds.   No murmur heard. Pulmonary/Chest: Effort normal and breath sounds normal. No respiratory distress.  Musculoskeletal:  picc wihtout erythema  Skin: No rash noted.    SH: no tobacco      Assessment & Plan:

## 2016-12-22 NOTE — Assessment & Plan Note (Signed)
Creat, WBC have remained stable

## 2016-12-22 NOTE — Assessment & Plan Note (Signed)
Improved and inflammatory marker reviewed and last done are ESR 31 and CRP 10.5, minimally elevated.  rtc 3 weeks and will stop at that time if she is still doing well.  If some elevation of inflammatory markers, will consider oral therapy for 1 month

## 2016-12-22 NOTE — Assessment & Plan Note (Signed)
picc line functioning well.  No issues

## 2016-12-29 ENCOUNTER — Other Ambulatory Visit: Payer: Self-pay | Admitting: Pharmacist

## 2017-01-04 LAB — FUNGAL ORGANISM REFLEX

## 2017-01-04 LAB — FUNGUS CULTURE WITH STAIN

## 2017-01-04 LAB — FUNGUS CULTURE RESULT

## 2017-01-05 ENCOUNTER — Other Ambulatory Visit: Payer: Self-pay | Admitting: Pharmacist

## 2017-01-09 ENCOUNTER — Encounter: Payer: Self-pay | Admitting: Internal Medicine

## 2017-01-10 ENCOUNTER — Telehealth: Payer: Self-pay | Admitting: Pharmacist

## 2017-01-10 ENCOUNTER — Ambulatory Visit (INDEPENDENT_AMBULATORY_CARE_PROVIDER_SITE_OTHER): Payer: BC Managed Care – PPO | Admitting: Internal Medicine

## 2017-01-10 ENCOUNTER — Telehealth: Payer: Self-pay

## 2017-01-10 ENCOUNTER — Encounter: Payer: Self-pay | Admitting: Internal Medicine

## 2017-01-10 VITALS — BP 185/92 | HR 91 | Temp 98.5°F | Ht 59.0 in | Wt 99.0 lb

## 2017-01-10 DIAGNOSIS — Z452 Encounter for adjustment and management of vascular access device: Secondary | ICD-10-CM | POA: Diagnosis not present

## 2017-01-10 DIAGNOSIS — M4620 Osteomyelitis of vertebra, site unspecified: Secondary | ICD-10-CM | POA: Diagnosis not present

## 2017-01-10 DIAGNOSIS — Z5181 Encounter for therapeutic drug level monitoring: Secondary | ICD-10-CM

## 2017-01-10 NOTE — Assessment & Plan Note (Signed)
Recent creat, wbc results reviewed and no concerns.

## 2017-01-10 NOTE — Assessment & Plan Note (Addendum)
Doing well and no concerns.  Will finish on Nov 1st and stop.  Home health to pull picc line.  rtc 4 weeks, will recheck ESR, CRP then

## 2017-01-10 NOTE — Assessment & Plan Note (Signed)
No issues with line and will have it removed at the end of treatment this week

## 2017-01-10 NOTE — Telephone Encounter (Signed)
Received call from [place], [facility] pharmacist regarding patient's most recent labs.  Patient's wbc 1.8 today - she was seen in clinic today by Dr. Linus Salmons.  He reviewed labs and is ok to continue ceftriaxone until intended stop date. [facility] notified.

## 2017-01-10 NOTE — Progress Notes (Signed)
   Subjective:    Patient ID: Audrey Walter, female    DOB: [birth year], 66 y.o.   MRN: [id number]  HPI Here for follow up of discitis/osteomyelitis.   Audrey Walter is a 66 y.o. female with about a 2 month history of back pain and history of nephrolithiasis who had an MRI c/w discitis/osteomyelitis.  She was on several courses of oral antibiotics and IM Rocephin for presumed urinary source.  MRI with some psoas fluid as well.  Baseline CRP 1.6, ESR 45. Her culture grew E coli and I put her on ceftriaxone for 6 weeks and close to finishing on November 2nd.  She continues to feel better, more active and no issues.  No associated n/v/d.  No rash.  No issues with picc line.  recent CRP 1.2, ESR 5.  No fever, no chills, no worsening pain.    Review of Systems  Constitutional: Negative for fatigue.  Gastrointestinal: Negative for diarrhea and nausea.  Skin: Negative for rash.       Objective:   Physical Exam  Constitutional: She appears well-developed and well-nourished. No distress.  Eyes: No scleral icterus.  Cardiovascular: Normal rate, regular rhythm and normal heart sounds.   No murmur heard. Pulmonary/Chest: Effort normal and breath sounds normal. No respiratory distress.  Musculoskeletal:  picc wihtout erythema  Skin: No rash noted.    SH: no tobacco      Assessment & Plan:

## 2017-01-10 NOTE — Telephone Encounter (Signed)
Per verbal order from Dr. Linus Salmons, this nurse notified [facility] [place] to pull PICC at the end of treatment. Order was verified by Barnett Applebaum. Pola Corn

## 2017-01-23 DIAGNOSIS — Z1231 Encounter for screening mammogram for malignant neoplasm of breast: Secondary | ICD-10-CM | POA: Diagnosis not present

## 2017-02-07 ENCOUNTER — Ambulatory Visit (INDEPENDENT_AMBULATORY_CARE_PROVIDER_SITE_OTHER): Payer: Medicare Other | Admitting: Internal Medicine

## 2017-02-07 ENCOUNTER — Encounter: Payer: Self-pay | Admitting: Internal Medicine

## 2017-02-07 VITALS — BP 174/92 | HR 87 | Temp 98.0°F | Wt 99.0 lb

## 2017-02-07 DIAGNOSIS — Z452 Encounter for adjustment and management of vascular access device: Secondary | ICD-10-CM | POA: Diagnosis not present

## 2017-02-07 DIAGNOSIS — M4620 Osteomyelitis of vertebra, site unspecified: Secondary | ICD-10-CM

## 2017-02-07 NOTE — Assessment & Plan Note (Signed)
This has been removed. No issues post removal.

## 2017-02-07 NOTE — Assessment & Plan Note (Signed)
Treated and feeling well.  Will recheck esr and crp and if ok, no follow up indicated.

## 2017-02-07 NOTE — Progress Notes (Signed)
   Subjective:    Patient ID: Audrey Walter, female    DOB: Jul 05, 1950, 66 y.o.   MRN: [id number]  HPI Here for follow up of discitis/osteomyelitis.   Audrey Walter is a 66 y.o. female with about a 2 month history of back pain and history of nephrolithiasis who had an MRI c/w discitis/osteomyelitis.  She was on several courses of oral antibiotics and IM Rocephin for presumed urinary source.  MRI with some psoas fluid as well.  Baseline CRP 1.6, ESR 45. Her culture grew E coli and I put her on ceftriaxone for 6 weeks and close to finished on November 2nd.  She continues to feel well and having no pain now at all.  No diarrhea, no associated rash. No fever.    Review of Systems  Constitutional: Negative for fatigue.  Gastrointestinal: Negative for diarrhea and nausea.  Skin: Negative for rash.       Objective:   Physical Exam  Constitutional: She appears well-developed and well-nourished. No distress.  Eyes: No scleral icterus.  Cardiovascular: Normal rate, regular rhythm and normal heart sounds.  No murmur heard. Pulmonary/Chest: Effort normal and breath sounds normal. No respiratory distress.  Skin: No rash noted.        Assessment & Plan:

## 2017-02-08 LAB — C-REACTIVE PROTEIN: CRP: 4.6 mg/L (ref <8.0)

## 2017-02-08 LAB — SEDIMENTATION RATE: SED RATE: 19 mm/h (ref 0–30)

## 2017-02-15 DIAGNOSIS — N6092 Unspecified benign mammary dysplasia of left breast: Secondary | ICD-10-CM | POA: Diagnosis not present

## 2017-02-15 DIAGNOSIS — R922 Inconclusive mammogram: Secondary | ICD-10-CM | POA: Diagnosis not present

## 2017-03-08 DIAGNOSIS — D2239 Melanocytic nevi of other parts of face: Secondary | ICD-10-CM | POA: Diagnosis not present

## 2017-03-08 DIAGNOSIS — L821 Other seborrheic keratosis: Secondary | ICD-10-CM | POA: Diagnosis not present

## 2017-03-08 DIAGNOSIS — Z85828 Personal history of other malignant neoplasm of skin: Secondary | ICD-10-CM | POA: Diagnosis not present

## 2017-03-08 DIAGNOSIS — D1801 Hemangioma of skin and subcutaneous tissue: Secondary | ICD-10-CM | POA: Diagnosis not present

## 2017-03-08 DIAGNOSIS — L570 Actinic keratosis: Secondary | ICD-10-CM | POA: Diagnosis not present

## 2017-11-28 DIAGNOSIS — R300 Dysuria: Secondary | ICD-10-CM | POA: Diagnosis not present

## 2017-12-07 DIAGNOSIS — R636 Underweight: Secondary | ICD-10-CM | POA: Diagnosis not present

## 2017-12-07 DIAGNOSIS — N390 Urinary tract infection, site not specified: Secondary | ICD-10-CM | POA: Diagnosis not present

## 2017-12-07 DIAGNOSIS — Z0000 Encounter for general adult medical examination without abnormal findings: Secondary | ICD-10-CM | POA: Diagnosis not present

## 2017-12-07 DIAGNOSIS — R030 Elevated blood-pressure reading, without diagnosis of hypertension: Secondary | ICD-10-CM | POA: Diagnosis not present

## 2017-12-07 DIAGNOSIS — Z7689 Persons encountering health services in other specified circumstances: Secondary | ICD-10-CM | POA: Diagnosis not present

## 2017-12-07 DIAGNOSIS — E441 Mild protein-calorie malnutrition: Secondary | ICD-10-CM | POA: Diagnosis not present

## 2017-12-12 DIAGNOSIS — Z0000 Encounter for general adult medical examination without abnormal findings: Secondary | ICD-10-CM | POA: Diagnosis not present

## 2017-12-12 DIAGNOSIS — R636 Underweight: Secondary | ICD-10-CM | POA: Diagnosis not present

## 2017-12-12 DIAGNOSIS — Z780 Asymptomatic menopausal state: Secondary | ICD-10-CM | POA: Diagnosis not present

## 2017-12-22 DIAGNOSIS — M8589 Other specified disorders of bone density and structure, multiple sites: Secondary | ICD-10-CM | POA: Diagnosis not present

## 2017-12-22 DIAGNOSIS — Z780 Asymptomatic menopausal state: Secondary | ICD-10-CM | POA: Diagnosis not present

## 2018-02-01 DIAGNOSIS — R6883 Chills (without fever): Secondary | ICD-10-CM | POA: Diagnosis not present

## 2018-02-01 DIAGNOSIS — M545 Low back pain: Secondary | ICD-10-CM | POA: Diagnosis not present

## 2018-02-01 DIAGNOSIS — N132 Hydronephrosis with renal and ureteral calculous obstruction: Secondary | ICD-10-CM | POA: Diagnosis not present

## 2018-02-01 DIAGNOSIS — K4090 Unilateral inguinal hernia, without obstruction or gangrene, not specified as recurrent: Secondary | ICD-10-CM | POA: Diagnosis not present

## 2018-02-01 DIAGNOSIS — R112 Nausea with vomiting, unspecified: Secondary | ICD-10-CM | POA: Diagnosis not present

## 2018-02-20 DIAGNOSIS — N132 Hydronephrosis with renal and ureteral calculous obstruction: Secondary | ICD-10-CM | POA: Diagnosis not present

## 2018-02-20 DIAGNOSIS — R112 Nausea with vomiting, unspecified: Secondary | ICD-10-CM | POA: Diagnosis not present

## 2018-02-20 DIAGNOSIS — N1330 Unspecified hydronephrosis: Secondary | ICD-10-CM | POA: Diagnosis not present

## 2018-02-20 DIAGNOSIS — K4090 Unilateral inguinal hernia, without obstruction or gangrene, not specified as recurrent: Secondary | ICD-10-CM | POA: Diagnosis not present

## 2018-02-20 DIAGNOSIS — I700 Atherosclerosis of aorta: Secondary | ICD-10-CM | POA: Diagnosis not present

## 2018-02-20 DIAGNOSIS — R1032 Left lower quadrant pain: Secondary | ICD-10-CM | POA: Diagnosis not present

## 2018-02-20 DIAGNOSIS — N202 Calculus of kidney with calculus of ureter: Secondary | ICD-10-CM | POA: Diagnosis not present

## 2018-02-20 DIAGNOSIS — M4317 Spondylolisthesis, lumbosacral region: Secondary | ICD-10-CM | POA: Diagnosis not present

## 2018-02-20 DIAGNOSIS — Q2546 Tortuous aortic arch: Secondary | ICD-10-CM | POA: Diagnosis not present

## 2018-02-20 DIAGNOSIS — R109 Unspecified abdominal pain: Secondary | ICD-10-CM | POA: Diagnosis not present

## 2018-02-20 DIAGNOSIS — N200 Calculus of kidney: Secondary | ICD-10-CM | POA: Diagnosis not present

## 2018-02-22 DIAGNOSIS — Z1231 Encounter for screening mammogram for malignant neoplasm of breast: Secondary | ICD-10-CM | POA: Diagnosis not present

## 2018-03-06 DIAGNOSIS — N132 Hydronephrosis with renal and ureteral calculous obstruction: Secondary | ICD-10-CM | POA: Diagnosis not present

## 2018-03-06 DIAGNOSIS — K469 Unspecified abdominal hernia without obstruction or gangrene: Secondary | ICD-10-CM | POA: Diagnosis not present

## 2018-03-06 DIAGNOSIS — N201 Calculus of ureter: Secondary | ICD-10-CM | POA: Diagnosis not present

## 2018-03-15 DIAGNOSIS — N132 Hydronephrosis with renal and ureteral calculous obstruction: Secondary | ICD-10-CM | POA: Diagnosis not present

## 2018-03-15 DIAGNOSIS — N200 Calculus of kidney: Secondary | ICD-10-CM | POA: Diagnosis not present

## 2018-05-21 DIAGNOSIS — L570 Actinic keratosis: Secondary | ICD-10-CM | POA: Diagnosis not present

## 2018-05-21 DIAGNOSIS — L738 Other specified follicular disorders: Secondary | ICD-10-CM | POA: Diagnosis not present

## 2018-09-18 DIAGNOSIS — N200 Calculus of kidney: Secondary | ICD-10-CM | POA: Diagnosis not present

## 2018-09-18 DIAGNOSIS — Z09 Encounter for follow-up examination after completed treatment for conditions other than malignant neoplasm: Secondary | ICD-10-CM | POA: Diagnosis not present

## 2018-09-18 DIAGNOSIS — Z87442 Personal history of urinary calculi: Secondary | ICD-10-CM | POA: Diagnosis not present

## 2018-12-16 IMAGING — XA IR FLUORO GUIDE NDL PLMT / BX
1 series · 6 of 6 positions shown · non-contrast
Comparison: none

INDICATION: Lumbar discitis.

[Series 300: dsa body · 6 of 6 slices shown]
[im 1/6]
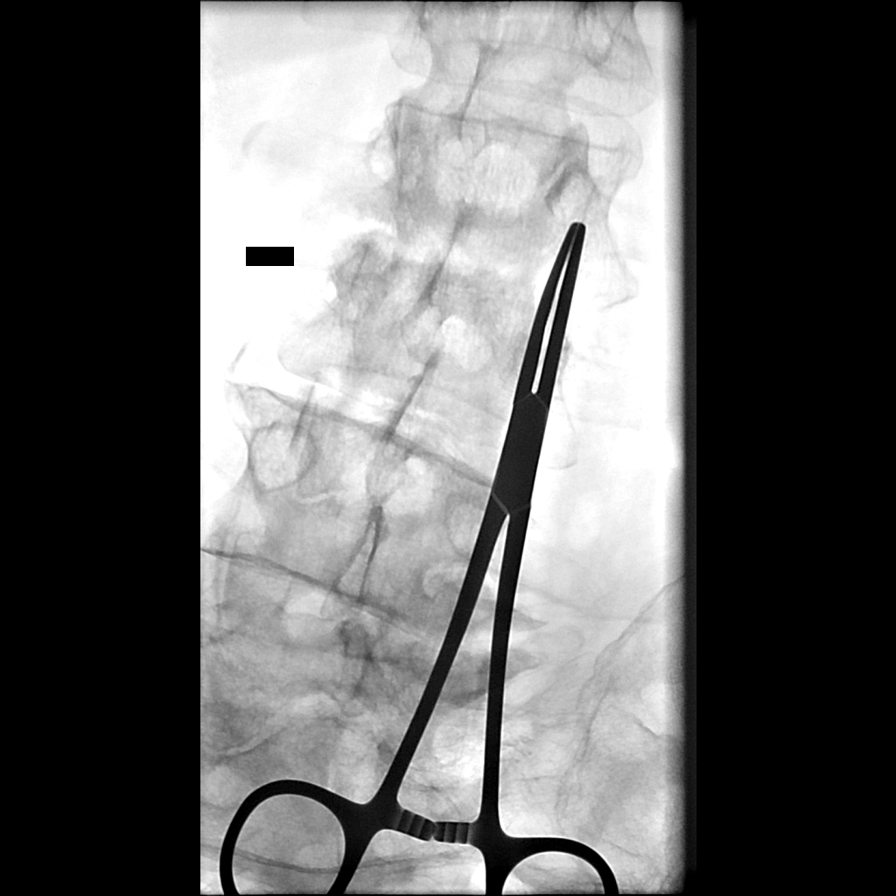
[im 2/6]
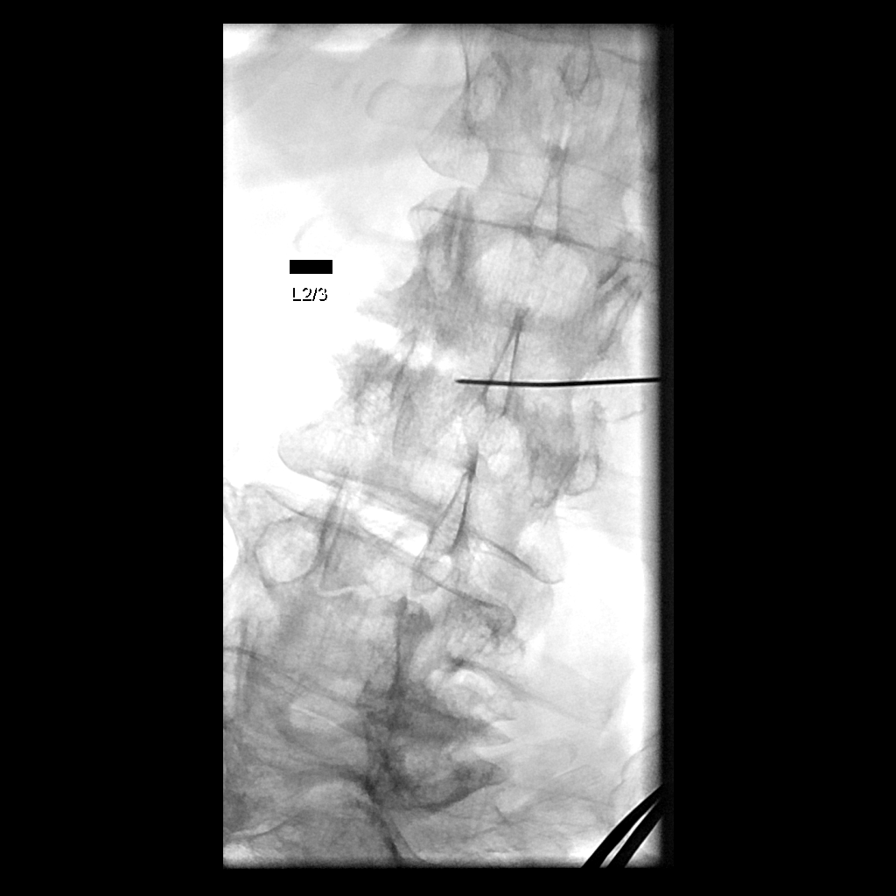
[im 3/6]
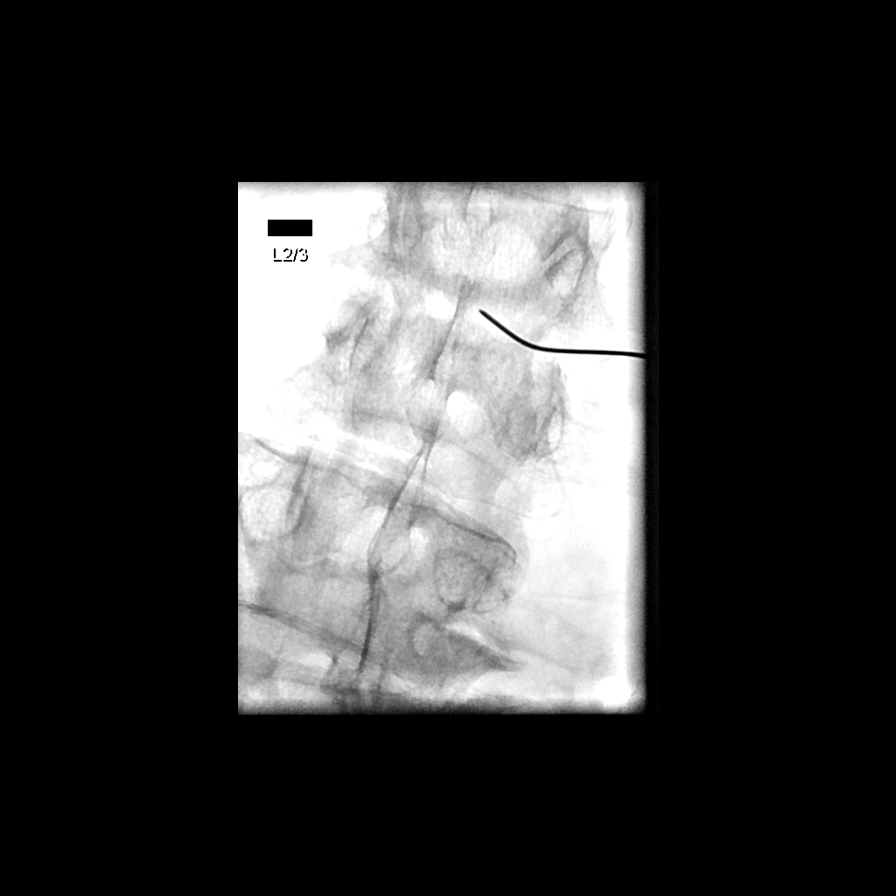
[im 4/6]
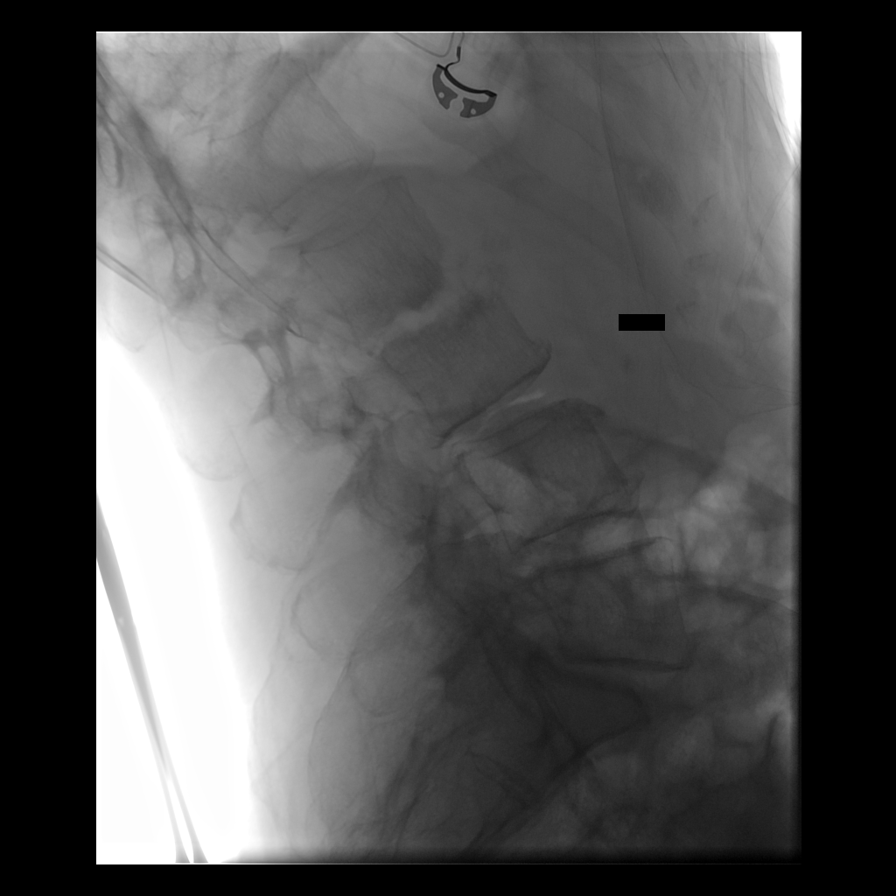
[im 5/6]
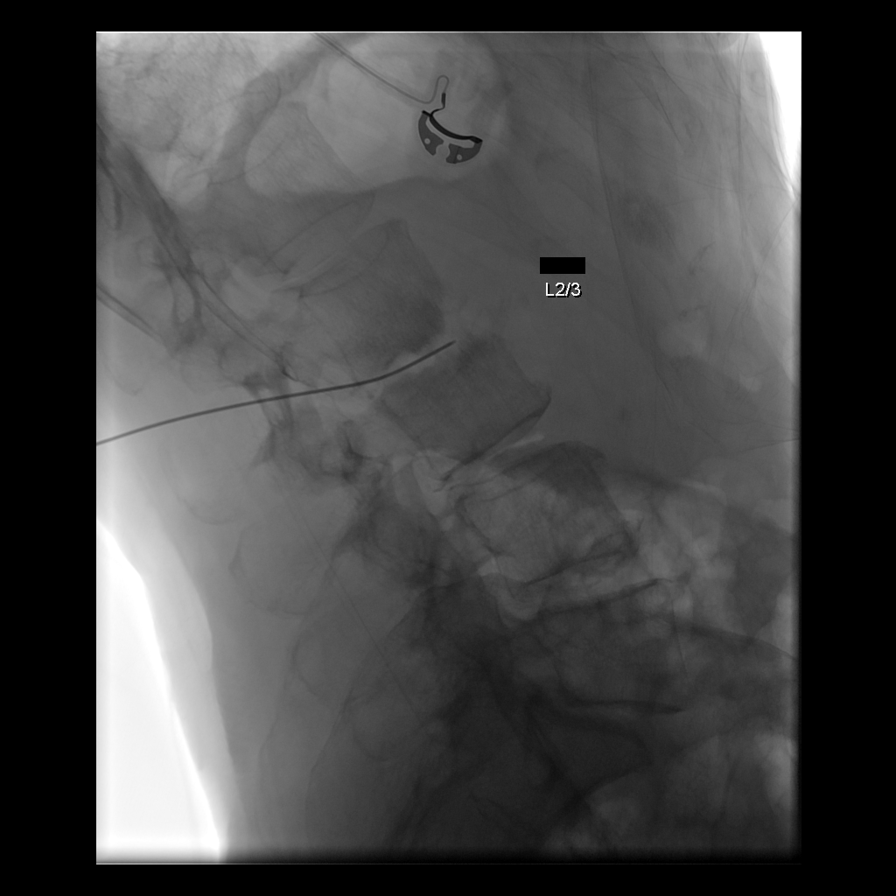
[im 6/6]
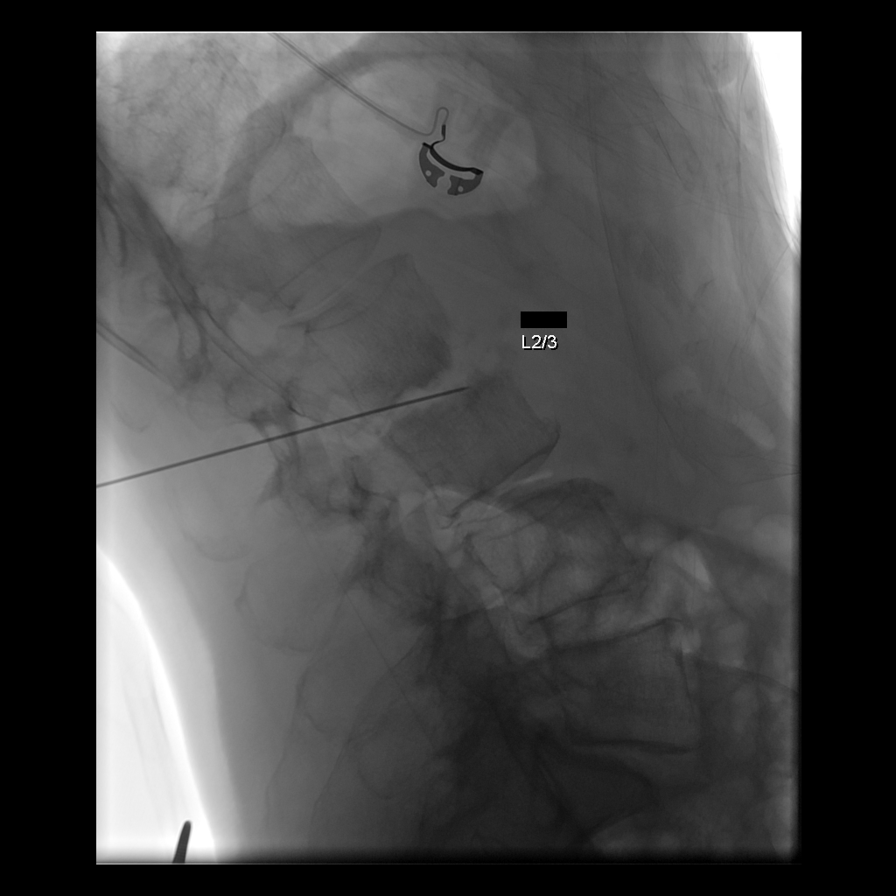

[6 of 6 positions shown; findings below may reference images not displayed]

EXAM:
LUMBAR DISC ASPIRATION

MEDICATIONS:
None.

ANESTHESIA/SEDATION:
Moderate (conscious) sedation was employed during this procedure. A
total of Versed 1 mg and Fentanyl 25 mcg was administered
intravenously.

Moderate Sedation Time: 20 minutes. The patient's level of
consciousness and vital signs were monitored continuously by
radiology nursing throughout the procedure under my direct
supervision.

FLUOROSCOPY TIME:  Fluoroscopy Time: 5 minutes 12 seconds (141 mGy).

COMPLICATIONS:
None immediate.

PROCEDURE:
Informed written consent was obtained from the patient after a
thorough discussion of the procedural risks, benefits and
alternatives. All questions were addressed. Maximal Sterile Barrier
Technique was utilized including caps, mask, sterile gowns, sterile
gloves, sterile drape, hand hygiene and skin antiseptic. A timeout
was performed prior to the initiation of the procedure.

The patient was laid prone on the fluoroscopic table.

The skin overlying the lumbar region was then prepped and draped in
the usual sterile fashion.

The skin entry site at L2-L3 to the right of midline was then
infiltrated with 0.25% bupivacaine.

This was then followed by the advancement of a 21 gauge Arissa
biopsy needle into the L2-L3 disc space using biplane intermittent
fluoroscopy. Two passes were made using 2 separate sterile needles
into different locations in the L2-L3 disc spaces.

Approximately 4.5 mL of thick bloody aspirate was obtained and sent
for microbiologic analysis.

The needle was removed. Hemostasis was achieved at the skin entry
site.

The patient tolerated the procedure well. There were no acute
complications.
IMPRESSION: Status post fluoroscopic guided needle placement at L2-L3 for disc
aspiration as described above.

## 2018-12-24 DIAGNOSIS — R636 Underweight: Secondary | ICD-10-CM | POA: Diagnosis not present

## 2018-12-24 DIAGNOSIS — Z0000 Encounter for general adult medical examination without abnormal findings: Secondary | ICD-10-CM | POA: Diagnosis not present

## 2018-12-24 DIAGNOSIS — Z681 Body mass index (BMI) 19 or less, adult: Secondary | ICD-10-CM | POA: Diagnosis not present

## 2018-12-24 DIAGNOSIS — R799 Abnormal finding of blood chemistry, unspecified: Secondary | ICD-10-CM | POA: Diagnosis not present
